# Patient Record
Sex: Female | Born: 1985 | Hispanic: No | Marital: Single | Smoking: Never smoker
Health system: Southern US, Community
[De-identification: ages and names within clinical notes are randomized; demographics above are authoritative.]

## PROBLEM LIST (undated history)

## (undated) DIAGNOSIS — R51 Headache: Secondary | ICD-10-CM

## (undated) DIAGNOSIS — J45909 Unspecified asthma, uncomplicated: Secondary | ICD-10-CM

## (undated) DIAGNOSIS — R519 Headache, unspecified: Secondary | ICD-10-CM

## (undated) DIAGNOSIS — K219 Gastro-esophageal reflux disease without esophagitis: Secondary | ICD-10-CM

## (undated) HISTORY — DX: Unspecified asthma, uncomplicated: J45.909

---

## 2015-08-12 ENCOUNTER — Other Ambulatory Visit: Payer: Self-pay | Admitting: Orthopedic Surgery

## 2015-08-12 DIAGNOSIS — M545 Low back pain, unspecified: Secondary | ICD-10-CM

## 2015-08-12 DIAGNOSIS — G8929 Other chronic pain: Secondary | ICD-10-CM

## 2015-08-26 ENCOUNTER — Ambulatory Visit
Admission: RE | Admit: 2015-08-26 | Discharge: 2015-08-26 | Disposition: A | Discharge status: Home / Self Care | Payer: Worker's Compensation | Source: Ambulatory Visit | Attending: Orthopedic Surgery | Admitting: Orthopedic Surgery

## 2015-08-26 ENCOUNTER — Other Ambulatory Visit: Payer: Self-pay | Admitting: Orthopedic Surgery

## 2015-08-26 ENCOUNTER — Inpatient Hospital Stay
Admission: RE | Admit: 2015-08-26 | Discharge: 2015-08-26 | Disposition: A | Discharge status: Home / Self Care | Payer: Self-pay | Source: Ambulatory Visit | Attending: Orthopedic Surgery | Admitting: Orthopedic Surgery

## 2015-08-26 VITALS — BP 123/87 | HR 76 | Temp 97.6°F | Resp 12 | Ht 64.0 in | Wt 190.0 lb

## 2015-08-26 DIAGNOSIS — G8929 Other chronic pain: Secondary | ICD-10-CM

## 2015-08-26 DIAGNOSIS — M545 Low back pain, unspecified: Secondary | ICD-10-CM

## 2015-08-26 MED ORDER — ONDANSETRON HCL 4 MG/2ML IJ SOLN: 4.0000 mg | Freq: Four times a day (QID) | INTRAMUSCULAR | Status: DC | PRN

## 2015-08-26 MED ORDER — IOHEXOL 180 MG/ML  SOLN
4.5000 mL | Freq: Once | INTRAMUSCULAR | Status: AC | PRN
  Administered 2015-08-26: 4.5 mL

## 2015-08-26 MED ORDER — MIDAZOLAM HCL 2 MG/2ML IJ SOLN
1.0000 mg | INTRAMUSCULAR | Status: DC | PRN
  Administered 2015-08-26: 1 mg via INTRAVENOUS

## 2015-08-26 MED ORDER — CEFAZOLIN SODIUM-DEXTROSE 2-3 GM-% IV SOLR
2.0000 g | Freq: Once | INTRAVENOUS | Status: AC
  Administered 2015-08-26: 2 g via INTRAVENOUS

## 2015-08-26 MED ORDER — FENTANYL CITRATE (PF) 100 MCG/2ML IJ SOLN
25.0000 ug | INTRAMUSCULAR | Status: DC | PRN
  Administered 2015-08-26 (×2): 50 ug via INTRAVENOUS

## 2015-08-26 MED ORDER — SODIUM CHLORIDE 0.9 % IV SOLN
Freq: Once | INTRAVENOUS | Status: AC
  Administered 2015-08-26: 08:00:00 via INTRAVENOUS

## 2015-08-26 MED ORDER — KETOROLAC TROMETHAMINE 30 MG/ML IJ SOLN
30.0000 mg | Freq: Once | INTRAMUSCULAR | Status: AC
  Administered 2015-08-26: 30 mg via INTRAVENOUS

## 2015-08-26 MED ORDER — ONDANSETRON HCL 4 MG/2ML IJ SOLN
4.0000 mg | Freq: Once | INTRAMUSCULAR | Status: AC
  Administered 2015-08-26: 4 mg via INTRAVENOUS

## 2015-08-26 NOTE — Progress Notes (Signed)
These vital signs were taken @ 0831; documented in wrong column

## 2015-08-26 NOTE — Progress Notes (Signed)
25 minutes of sedation time for discogram.  Donell SievertJeanne Miguel Christiana, RN

## 2015-08-26 NOTE — Discharge Instructions (Signed)
Discogram Post Procedure Discharge Instructions ° °1. May resume a regular diet and any medications that you routinely take (including pain medications). °2. No driving day of procedure. °3. Upon discharge go home and rest for at least 4 hours.  May use an ice pack as needed to injection sites on back.  Ice to back 30 minutes on and 30 minutes off, all day. °4. May remove bandades later, today. °5. It is not unusual to be sore for several days after this procedure. ° ° ° °Please contact our office at [phone] for the following symptoms: ° °· Fever greater than 100 degrees °· Increased swelling, pain, or redness at injection site. ° ° °Thank you for visiting [facility]. ° ° °

## 2015-10-13 ENCOUNTER — Telehealth: Payer: Self-pay | Admitting: Vascular Surgery

## 2015-10-13 NOTE — Telephone Encounter (Signed)
sched appt 5/30 at 1. Lm on hm# to inform pt of appt.

## 2015-10-13 NOTE — Telephone Encounter (Signed)
-----   Message from Glori BickersStephanie N Dehart, RN sent at 10/13/2015  1:29 PM EDT ----- Regarding: OV with TFE [name] is scheduled for an ALIF L4-5 with Drs. Early and Dumonski on Wednesday, 6/14. She will need an OV with Dr. Arbie CookeyEarly at least 2 weeks prior to procedure. Please have the patient bring LS spine films to OV.   Thanks, Judeth CornfieldStephanie

## 2015-10-22 ENCOUNTER — Other Ambulatory Visit: Payer: Self-pay | Admitting: Orthopedic Surgery

## 2015-10-28 ENCOUNTER — Encounter: Payer: Self-pay | Admitting: Vascular Surgery

## 2015-10-29 ENCOUNTER — Other Ambulatory Visit: Payer: Self-pay

## 2015-10-30 ENCOUNTER — Encounter: Payer: Self-pay | Admitting: Vascular Surgery

## 2015-11-03 ENCOUNTER — Encounter: Payer: Self-pay | Admitting: Vascular Surgery

## 2015-11-12 ENCOUNTER — Encounter (HOSPITAL_COMMUNITY)
Admission: RE | Admit: 2015-11-12 | Discharge: 2015-11-12 | Disposition: A | Discharge status: Home / Self Care | Payer: Worker's Compensation | Source: Ambulatory Visit | Attending: Orthopedic Surgery | Admitting: Orthopedic Surgery

## 2015-11-12 ENCOUNTER — Encounter (HOSPITAL_COMMUNITY): Payer: Self-pay

## 2015-11-12 ENCOUNTER — Encounter: Payer: Self-pay | Admitting: Vascular Surgery

## 2015-11-12 DIAGNOSIS — M5136 Other intervertebral disc degeneration, lumbar region: Secondary | ICD-10-CM | POA: Diagnosis not present

## 2015-11-12 DIAGNOSIS — J45909 Unspecified asthma, uncomplicated: Secondary | ICD-10-CM | POA: Insufficient documentation

## 2015-11-12 DIAGNOSIS — Z01818 Encounter for other preprocedural examination: Secondary | ICD-10-CM | POA: Diagnosis present

## 2015-11-12 DIAGNOSIS — Z01812 Encounter for preprocedural laboratory examination: Secondary | ICD-10-CM | POA: Insufficient documentation

## 2015-11-12 DIAGNOSIS — Z0183 Encounter for blood typing: Secondary | ICD-10-CM | POA: Insufficient documentation

## 2015-11-12 DIAGNOSIS — I498 Other specified cardiac arrhythmias: Secondary | ICD-10-CM | POA: Diagnosis not present

## 2015-11-12 HISTORY — DX: Headache, unspecified: R51.9

## 2015-11-12 HISTORY — DX: Headache: R51

## 2015-11-12 HISTORY — DX: Gastro-esophageal reflux disease without esophagitis: K21.9

## 2015-11-12 LAB — CBC WITH DIFFERENTIAL/PLATELET
BASOS ABS: 0 10*3/uL (ref 0.0–0.1)
BASOS PCT: 0 %
EOS PCT: 4 %
Eosinophils Absolute: 0.4 10*3/uL (ref 0.0–0.7)
HEMATOCRIT: 40.5 % (ref 36.0–46.0)
Hemoglobin: 13.4 g/dL (ref 12.0–15.0)
LYMPHS PCT: 21 %
Lymphs Abs: 1.8 10*3/uL (ref 0.7–4.0)
MCH: 27.3 pg (ref 26.0–34.0)
MCHC: 33.1 g/dL (ref 30.0–36.0)
MCV: 82.7 fL (ref 78.0–100.0)
MONO ABS: 0.6 10*3/uL (ref 0.1–1.0)
Monocytes Relative: 7 %
NEUTROS ABS: 5.7 10*3/uL (ref 1.7–7.7)
Neutrophils Relative %: 68 %
PLATELETS: 334 10*3/uL (ref 150–400)
RBC: 4.9 MIL/uL (ref 3.87–5.11)
RDW: 13.6 % (ref 11.5–15.5)
WBC: 8.4 10*3/uL (ref 4.0–10.5)

## 2015-11-12 LAB — COMPREHENSIVE METABOLIC PANEL
ALBUMIN: 4.1 g/dL (ref 3.5–5.0)
ALT: 22 U/L (ref 14–54)
AST: 16 U/L (ref 15–41)
Alkaline Phosphatase: 67 U/L (ref 38–126)
Anion gap: 7 (ref 5–15)
BUN: 14 mg/dL (ref 6–20)
CHLORIDE: 106 mmol/L (ref 101–111)
CO2: 25 mmol/L (ref 22–32)
CREATININE: 0.65 mg/dL (ref 0.44–1.00)
Calcium: 9.4 mg/dL (ref 8.9–10.3)
GFR calc Af Amer: >60 mL/min (ref >60)
GFR calc non Af Amer: >60 mL/min (ref >60)
GLUCOSE: 97 mg/dL (ref 65–99)
POTASSIUM: 3.6 mmol/L (ref 3.5–5.1)
Sodium: 138 mmol/L (ref 135–145)
Total Bilirubin: 0.8 mg/dL (ref 0.3–1.2)
Total Protein: 7.5 g/dL (ref 6.5–8.1)

## 2015-11-12 LAB — HCG, SERUM, QUALITATIVE: PREG SERUM: NEGATIVE

## 2015-11-12 LAB — URINALYSIS, ROUTINE W REFLEX MICROSCOPIC
BILIRUBIN URINE: NEGATIVE
Glucose, UA: NEGATIVE mg/dL
KETONES UR: NEGATIVE mg/dL
LEUKOCYTES UA: NEGATIVE
NITRITE: NEGATIVE
PH: 5.5 (ref 5.0–8.0)
PROTEIN: NEGATIVE mg/dL
Specific Gravity, Urine: 1.035 — ABNORMAL HIGH (ref 1.005–1.030)

## 2015-11-12 LAB — PROTIME-INR
INR: 1.12 (ref 0.00–1.49)
Prothrombin Time: 14.6 seconds (ref 11.6–15.2)

## 2015-11-12 LAB — URINE MICROSCOPIC-ADD ON

## 2015-11-12 LAB — SURGICAL PCR SCREEN
MRSA, PCR: NEGATIVE
Staphylococcus aureus: NEGATIVE

## 2015-11-12 LAB — TYPE AND SCREEN
ABO/RH(D): A POS
ANTIBODY SCREEN: NEGATIVE

## 2015-11-12 LAB — ABO/RH: ABO/RH(D): A POS

## 2015-11-12 LAB — APTT: APTT: 31 s (ref 24–37)

## 2015-11-12 NOTE — Pre-Procedure Instructions (Signed)
Elizabeth Davies  11/12/2015      [facility] [place] - [address] [place] [address] Phone: [phone] Fax: [phone]    Your procedure is scheduled on Wednesday, June 14th, 2017.  [facility] Admitting at 6:30 A.M.   Call this number if you have problems the morning of surgery:  [phone]   Remember:  Do not eat food or drink liquids after midnight.   Take these medicines the morning of surgery with A SIP OF WATER: Acetaminophen (Tylenol) if needed, Albuterol inhaler if needed (please bring with you).  Stop taking: Aspirin, NSAIDS, Aleve, Naproxen, Ibuprofen, Advil, Motrin, BC's, Goody's, Fish oil, all herbal medications, and all vitamins.    Do not wear jewelry, make-up or nail polish.  Do not wear lotions, powders, or perfumes.  You may NOT wear deodorant.  Do not shave 48 hours prior to surgery.    Do not bring valuables to the hospital.   [facility] is not responsible for any belongings or valuables.  Contacts, dentures or bridgework may not be worn into surgery.  Leave your suitcase in the car.  After surgery it may be brought to your room.  For patients admitted to the hospital, discharge time will be determined by your treatment team.  Patients discharged the day of surgery will not be allowed to drive home.   Special instructions:  See attached.   Please read over the following fact sheets that you were given. Blood Transfusion Information and MRSA Information     [place]- Preparing For Surgery  Before surgery, you can play an important role. Because skin is not sterile, your skin needs to be as free of germs as possible. You can reduce the number of germs on your skin by washing with CHG (chlorahexidine gluconate) Soap before surgery.  CHG is an antiseptic cleaner which kills germs and bonds with the skin to continue killing germs even after washing.  Please  do not use if you have an allergy to CHG or antibacterial soaps. If your skin becomes reddened/irritated stop using the CHG.  Do not shave (including legs and underarms) for at least 48 hours prior to first CHG shower. It is OK to shave your face.  Please follow these instructions carefully.   1. Shower the NIGHT BEFORE SURGERY and the MORNING OF SURGERY with CHG.   2. If you chose to wash your hair, wash your hair first as usual with your normal shampoo.  3. After you shampoo, rinse your hair and body thoroughly to remove the shampoo.  4. Use CHG as you would any other liquid soap. You can apply CHG directly to the skin and wash gently with a scrungie or a clean washcloth.   5. Apply the CHG Soap to your body ONLY FROM THE NECK DOWN.  Do not use on open wounds or open sores. Avoid contact with your eyes, ears, mouth and genitals (private parts). Wash genitals (private parts) with your normal soap.  6. Wash thoroughly, paying special attention to the area where your surgery will be performed.  7. Thoroughly rinse your body with warm water from the neck down.  8. DO NOT shower/wash with your normal soap after using and rinsing off the CHG Soap.  9. Pat yourself dry with a CLEAN TOWEL.   10. Wear CLEAN PAJAMAS   11. Place CLEAN SHEETS on your bed the night of your first shower  and DO NOT SLEEP WITH PETS.    Day of Surgery: Do not apply any deodorants/lotions. Please wear clean clothes to the hospital/surgery center.

## 2015-11-12 NOTE — Progress Notes (Signed)
PCP- Elizabeth HugerSylvia Dickerson in [place], [place] Cardiologist - denies  EKG - denies CXR - denies  Echo/stress test/cardiac cath - denies  Patient denies chest pain and shortness of breath at PAT appointment.  Interpreter present at PAT appointment.

## 2015-11-12 NOTE — Pre-Procedure Instructions (Signed)
   Instrucciones Para Antes de la Ciruga   Su ciruga est programada para-(your procedure is scheduled on) Wednesday, June 14th, 2017.   [facility] Admitting - at 6:30 AM.    Por favor llame al [phone] si tiene algn problema en la maana de la ciruga. (please call if you have any problems the morning of surgery.)                  Recuerde: (Remember)   No coma alimentos ni tome lquidos, incluyendo agua, despus de la medianoche del  (Do not eat food or drink liquids including water after midnight on Tuesday   Tome estas medicinas en la maana de la ciruga con un SORBITO de agua (take these meds the morning of surgery with a SIP of water) Acetaminophen (Tylenol) if needed, Albuterol inhaler if needed (please bring with you)   Puede cepillarse los dientes en la maana de la [place]. (you may brush your teeth the morning of surgery)   No use joyas, maquillaje de ojos, lpiz labial, crema para el cuerpo o esmalte de uas oscuro. (Do not wear jewelry, eye makeup, lipstick, body lotion, or dark fingernail polish)   Puede usar desodorante. (you may wear deodorant)   Si va a ser ingresado despues de la ciruga, deje la maleta en el carro hasta que se le haya asignado una habitacin. (If you are to be admitted after surgery, leave suitcase in car until your room has been assigned.)   A los pacientes que se les d de alta el mismo da no se les permitir manejar a casa.  (Patients discharged on the day of surgery will not be allowed to drive home)   Use ropa suelta y cmoda de regreso a casa. (wear loose comfortable clothes for ride home)    Firma del paciente (patient signature) ______________________________________   Adam PhenixAdriana Groome  11/12/2015      [facility] [place] - [address] [place] [address] Phone: [phone] Fax: [phone]    Your  procedure is scheduled on Wednesday, June 14th, 2017.  [facility] at 6:30 A.M.   Call this number if you have problems the morning of surgery:  [phone]   Remember:  Do not eat food or drink liquids after midnight.   Take these medicines the morning of surgery with A SIP OF WATER: Acetaminophen (Tylenol) if needed, Albuterol inhaler if needed (please bring with you).  Stop taking: Aspirin, NSAIDS, Aleve, Naproxen, Ibuprofen, Advil, Motrin, BC's, Goody's, Fish oil, all herbal medications, and all vitamins.    Do not wear jewelry, make-up or nail polish.  Do not wear lotions, powders, or perfumes.  You may wear deodorant.  Do not shave 48 hours prior to surgery.    Do not bring valuables to the hospital.   [facility] is not responsible for any belongings or valuables.  Contacts, dentures or bridgework may not be worn into surgery.  Leave your suitcase in the car.  After surgery it may be brought to your room.  For patients admitted to the hospital, discharge time will be determined by your treatment team.  Patients discharged the day of surgery will not be allowed to drive home.   Special instructions:  See attached.   Please read over the following fact sheets that you were given. MRSA Information

## 2015-11-13 NOTE — H&P (Signed)
     PREOPERATIVE H&P  Chief Complaint: Low back pain  HPI: Elizabeth Davies is a 30 y.o. female who presents with ongoing pain in the low back  MRI reveals and x-rays reveal degenerative disc disease associated with the L4-L5 level.  The patient has had ongoing pain now for approximately 5 months, after a work injury dated 06/25/2014.  Patient is failed appropriate conservative treatment measures and continued to have pain.  A discogram was notable for concordant pain at L4-L5.  (see office notes for additional details regarding the patient's full course of treatment)  Past Medical History  Diagnosis Date  . Asthma     pt. denies  . Acid reflux   . Headache    Past Surgical History  Procedure Laterality Date  . Cesarean section      x2   Social History   Social History  . Marital Status: Single    Spouse Name: N/A  . Number of Children: N/A  . Years of Education: N/A   Social History Main Topics  . Smoking status: Never Smoker   . Smokeless tobacco: Never Used  . Alcohol Use: No  . Drug Use: No  . Sexual Activity: Not on file   Other Topics Concern  . Not on file   Social History Narrative   No family history on file. No Known Allergies Prior to Admission medications   Medication Sig Start Date End Date Taking? Authorizing Provider  acetaminophen (TYLENOL) 325 MG tablet Take 650 mg by mouth every 6 (six) hours as needed for mild pain.   Yes Historical Provider, MD  albuterol (PROVENTIL HFA;VENTOLIN HFA) 108 (90 Base) MCG/ACT inhaler Inhale into the lungs every 6 (six) hours as needed for wheezing or shortness of breath.   Yes Historical Provider, MD     All other systems have been reviewed and were otherwise negative with the exception of those mentioned in the HPI and as above.  Physical Exam: There were no vitals filed for this visit.  General: Alert, no acute distress Cardiovascular: No pedal edema Respiratory: No cyanosis, no use of accessory  musculature Skin: No lesions in the area of chief complaint Neurologic: Sensation intact distally Psychiatric: Patient is competent for consent with normal mood and affect Lymphatic: No axillary or cervical lymphadenopathy  MUSCULOSKELETAL: patient is clearly uncomfortable and walks with a very slow and guarded gait  Assessment/Plan: degenerative disc disease   Plan for Procedure(s): LUMBAR 4-5 ANTERIOR LUMBAR FUSION  LUMBAR 4-5 POSTERIOR LUMBAR FUSION WITH INSTRUMENTATION AND ALLOGRAFT     Emilee HeroUMONSKI,Kairee Kozma LEONARD, MD 11/13/2015 8:13 AM

## 2015-11-17 ENCOUNTER — Encounter: Payer: Self-pay | Admitting: Vascular Surgery

## 2015-11-17 ENCOUNTER — Ambulatory Visit (INDEPENDENT_AMBULATORY_CARE_PROVIDER_SITE_OTHER): Payer: Worker's Compensation | Admitting: Vascular Surgery

## 2015-11-17 VITALS — BP 134/89 | HR 90 | Ht 64.0 in | Wt 184.1 lb

## 2015-11-17 DIAGNOSIS — M5136 Other intervertebral disc degeneration, lumbar region: Secondary | ICD-10-CM

## 2015-11-17 MED ORDER — CEFAZOLIN SODIUM-DEXTROSE 2-4 GM/100ML-% IV SOLN
2.0000 g | INTRAVENOUS | Status: AC
  Administered 2015-11-18: 2 g via INTRAVENOUS
  Filled 2015-11-17: qty 100

## 2015-11-17 NOTE — Progress Notes (Signed)
[facility] of [place]  Patient name: Elizabeth Davies MRN: [id number] DOB: 03-19-1986 Sex: female  REASON FOR CONSULT: Degenerative disc disease  HPI: Elizabeth Davies is a 30 y.o. female, who is here today for discussion of my role in anterior abdominal exposure. Has severe degenerative disc disease. She has been seen by Dr.Dumonski who has recommended anterior approach for disc surgery. Her only prior abdominal surgery has been necessary and section 2. She has no major medical difficulties.  Past Medical History  Diagnosis Date  . Asthma     pt. denies  . Acid reflux   . Headache     History reviewed. No pertinent family history.  SOCIAL HISTORY: Social History   Social History  . Marital Status: Single    Spouse Name: N/A  . Number of Children: N/A  . Years of Education: N/A   Occupational History  . Not on file.   Social History Main Topics  . Smoking status: Never Smoker   . Smokeless tobacco: Never Used  . Alcohol Use: No  . Drug Use: No  . Sexual Activity: Not on file   Other Topics Concern  . Not on file   Social History Narrative    No Known Allergies  Current Outpatient Prescriptions  Medication Sig Dispense Refill  . acetaminophen (TYLENOL) 325 MG tablet Take 650 mg by mouth every 6 (six) hours as needed for mild pain. Reported on 11/17/2015    . albuterol (PROVENTIL HFA;VENTOLIN HFA) 108 (90 Base) MCG/ACT inhaler Inhale into the lungs every 6 (six) hours as needed for wheezing or shortness of breath. Reported on 11/17/2015     No current facility-administered medications for this visit.    REVIEW OF SYSTEMS:   denotes positive finding,  denotes negative finding Cardiac  Comments:  Chest pain or chest pressure:    Shortness of breath upon exertion:    Short of breath when lying flat:    Irregular heart rhythm:        Vascular    Pain in calf, thigh, or hip brought on by ambulation:     Pain in feet at night that wakes you up from your sleep:     Blood clot in your veins:    Leg swelling:         Pulmonary    Oxygen at home:    Productive cough:     Wheezing:         Neurologic    Sudden weakness in arms or legs:     Sudden numbness in arms or legs:     Sudden onset of difficulty speaking or slurred speech:    Temporary loss of vision in one eye:     Problems with dizziness:         Gastrointestinal    Blood in stool:     Vomited blood:         Genitourinary    Burning when urinating:     Blood in urine:        Psychiatric    Major depression:         Hematologic    Bleeding problems:    Problems with blood clotting too easily:        Skin    Rashes or ulcers:        Constitutional    Fever or chills:      PHYSICAL EXAM: Filed Vitals:   11/17/15 0944  BP: 134/89  Pulse: 90  Height: 5\' 4"  (1.626 m)  Weight: 184 lb 1.6 oz (83.507 kg)  SpO2: 100%    GENERAL: The patient is a well-nourished female, in no acute distress. The vital signs are documented above. CARDIAC: There is a regular rate and rhythm.  VASCULAR: 2+ radial and 2+ dorsalis pedis pulses bilaterally  PULMONARY: There is good air exchange bilaterally without wheezing or rales. ABDOMEN: Soft and non-tender with normal pitched bowel sounds.  MUSCULOSKELETAL: There are no major deformities or cyanosis. NEUROLOGIC: No focal weakness or paresthesias are detected. SKIN: There are no ulcers or rashes noted. PSYCHIATRIC: The patient has a normal affect.  DATA:  Reviewed her CT scan of her spine. This shows no calcification of her aorta overlying her spine.   MEDICAL ISSUES: She is here today with her Spanish medical interpreter. I explained that her spine surgeon has determined that the most appropriate to treat her spine disease with surgery and that an anterior approach was most appropriate. I explained my role in exposure of the anterior surface of the L4-5 disc. Explained  mobilization of the intraperitoneal contents the left ureter and the arterial and venous structures over the spine. Explain the potential for injury to all of these. She understands and wishes to proceed with surgery as scheduled for tomorrow. Discussed this with both the Spanish interpreter and the Workmen's Comp. Spanish interpreter in the room explaining this to the patient.  Larina Earthlyodd F. Anaia Frith, MD FACS [facility] Office Tel [phone] Pager [phone]

## 2015-11-17 NOTE — Anesthesia Preprocedure Evaluation (Addendum)
Anesthesia Evaluation  Patient identified by MRN, date of birth, ID band Patient awake    Reviewed: Allergy & Precautions, H&P , NPO status , Patient's Chart, lab work & pertinent test results  Airway Mallampati: II  TM Distance: >3 FB Neck ROM: Full    Dental no notable dental hx. (+) Teeth Intact, Dental Advisory Given   Pulmonary asthma ,    Pulmonary exam normal breath sounds clear to auscultation       Cardiovascular negative cardio ROS   Rhythm:Regular Rate:Normal     Neuro/Psych  Headaches, negative psych ROS   GI/Hepatic negative GI ROS, Neg liver ROS,   Endo/Other  negative endocrine ROS  Renal/GU negative Renal ROS  negative genitourinary   Musculoskeletal   Abdominal   Peds  Hematology negative hematology ROS (+)   Anesthesia Other Findings   Reproductive/Obstetrics negative OB ROS                            Anesthesia Physical Anesthesia Plan  ASA: II  Anesthesia Plan: General   Post-op Pain Management:    Induction: Intravenous  Airway Management Planned: Oral ETT  Additional Equipment: Arterial line  Intra-op Plan:   Post-operative Plan: Extubation in OR  Informed Consent: I have reviewed the patients History and Physical, chart, labs and discussed the procedure including the risks, benefits and alternatives for the proposed anesthesia with the patient or authorized representative who has indicated his/her understanding and acceptance.   Dental advisory given  Plan Discussed with: CRNA  Anesthesia Plan Comments:        Anesthesia Quick Evaluation

## 2015-11-18 ENCOUNTER — Inpatient Hospital Stay (HOSPITAL_COMMUNITY): Payer: Worker's Compensation | Admitting: Emergency Medicine

## 2015-11-18 ENCOUNTER — Inpatient Hospital Stay (HOSPITAL_COMMUNITY)
Admission: RE | Admit: 2015-11-18 | Discharge: 2015-11-20 | DRG: 455 | Disposition: A | Discharge status: Home Health | Payer: Worker's Compensation | Source: Ambulatory Visit | Attending: Orthopedic Surgery | Admitting: Orthopedic Surgery

## 2015-11-18 ENCOUNTER — Inpatient Hospital Stay (HOSPITAL_COMMUNITY): Payer: Worker's Compensation | Admitting: Anesthesiology

## 2015-11-18 ENCOUNTER — Encounter (HOSPITAL_COMMUNITY)
Admission: RE | Disposition: A | Discharge status: Home Health | Payer: Self-pay | Source: Ambulatory Visit | Attending: Orthopedic Surgery

## 2015-11-18 ENCOUNTER — Inpatient Hospital Stay (HOSPITAL_COMMUNITY): Payer: Worker's Compensation

## 2015-11-18 DIAGNOSIS — M549 Dorsalgia, unspecified: Secondary | ICD-10-CM | POA: Diagnosis present

## 2015-11-18 DIAGNOSIS — M5416 Radiculopathy, lumbar region: Principal | ICD-10-CM | POA: Diagnosis present

## 2015-11-18 DIAGNOSIS — Z419 Encounter for procedure for purposes other than remedying health state, unspecified: Secondary | ICD-10-CM

## 2015-11-18 DIAGNOSIS — M5136 Other intervertebral disc degeneration, lumbar region: Secondary | ICD-10-CM | POA: Diagnosis present

## 2015-11-18 DIAGNOSIS — M5137 Other intervertebral disc degeneration, lumbosacral region: Secondary | ICD-10-CM

## 2015-11-18 DIAGNOSIS — M51369 Other intervertebral disc degeneration, lumbar region without mention of lumbar back pain or lower extremity pain: Secondary | ICD-10-CM | POA: Diagnosis present

## 2015-11-18 HISTORY — PX: ANTERIOR LUMBAR FUSION: SHX1170

## 2015-11-18 HISTORY — PX: ABDOMINAL EXPOSURE: SHX5708

## 2015-11-18 SURGERY — ANTERIOR LUMBAR FUSION 1 LEVEL
Anesthesia: General

## 2015-11-18 MED ORDER — ARTIFICIAL TEARS OP OINT
TOPICAL_OINTMENT | OPHTHALMIC | Status: DC | PRN
  Administered 2015-11-18: 1 via OPHTHALMIC

## 2015-11-18 MED ORDER — SUCCINYLCHOLINE CHLORIDE 200 MG/10ML IV SOSY
PREFILLED_SYRINGE | INTRAVENOUS | Status: AC
  Filled 2015-11-18: qty 10

## 2015-11-18 MED ORDER — LIDOCAINE 2% (20 MG/ML) 5 ML SYRINGE
INTRAMUSCULAR | Status: AC
  Filled 2015-11-18: qty 5

## 2015-11-18 MED ORDER — HYDROMORPHONE HCL 1 MG/ML IJ SOLN
0.2500 mg | INTRAMUSCULAR | Status: DC | PRN
  Administered 2015-11-18: 0.5 mg via INTRAVENOUS
  Administered 2015-11-18: 0.25 mg via INTRAVENOUS
  Administered 2015-11-18: 0.5 mg via INTRAVENOUS
  Administered 2015-11-18: 0.25 mg via INTRAVENOUS

## 2015-11-18 MED ORDER — SUCCINYLCHOLINE CHLORIDE 20 MG/ML IJ SOLN
INTRAMUSCULAR | Status: DC | PRN
  Administered 2015-11-18: 100 mg via INTRAVENOUS

## 2015-11-18 MED ORDER — ACETAMINOPHEN 325 MG PO TABS: 650.0000 mg | ORAL_TABLET | ORAL | Status: DC | PRN

## 2015-11-18 MED ORDER — MENTHOL 3 MG MT LOZG: 1.0000 | LOZENGE | OROMUCOSAL | Status: DC | PRN

## 2015-11-18 MED ORDER — CHLORHEXIDINE GLUCONATE 4 % EX LIQD
60.0000 mL | Freq: Once | CUTANEOUS | Status: DC
  Filled 2015-11-18: qty 60

## 2015-11-18 MED ORDER — SODIUM CHLORIDE 0.9% FLUSH
3.0000 mL | Freq: Two times a day (BID) | INTRAVENOUS | Status: DC
  Administered 2015-11-18 – 2015-11-20 (×3): 3 mL via INTRAVENOUS

## 2015-11-18 MED ORDER — PHENYLEPHRINE 40 MCG/ML (10ML) SYRINGE FOR IV PUSH (FOR BLOOD PRESSURE SUPPORT)
PREFILLED_SYRINGE | INTRAVENOUS | Status: AC
  Filled 2015-11-18: qty 10

## 2015-11-18 MED ORDER — ARTIFICIAL TEARS OP OINT
TOPICAL_OINTMENT | OPHTHALMIC | Status: AC
  Filled 2015-11-18: qty 10.5

## 2015-11-18 MED ORDER — PROMETHAZINE HCL 25 MG/ML IJ SOLN
12.5000 mg | Freq: Four times a day (QID) | INTRAMUSCULAR | Status: DC | PRN
Start: 2015-11-18 — End: 2015-11-20
  Administered 2015-11-19: 12.5 mg via INTRAVENOUS
  Filled 2015-11-18 (×2): qty 1

## 2015-11-18 MED ORDER — SODIUM CHLORIDE 0.9% FLUSH: 3.0000 mL | INTRAVENOUS | Status: DC | PRN

## 2015-11-18 MED ORDER — PROPOFOL 10 MG/ML IV BOLUS
INTRAVENOUS | Status: AC
  Filled 2015-11-18: qty 20

## 2015-11-18 MED ORDER — HYDROMORPHONE HCL 1 MG/ML IJ SOLN
INTRAMUSCULAR | Status: DC | PRN
  Administered 2015-11-18: .2 mg via INTRAVENOUS
  Administered 2015-11-18: .3 mg via INTRAVENOUS
  Administered 2015-11-18: .4 mg via INTRAVENOUS
  Administered 2015-11-18: .1 mg via INTRAVENOUS

## 2015-11-18 MED ORDER — DIAZEPAM 5 MG PO TABS
5.0000 mg | ORAL_TABLET | Freq: Four times a day (QID) | ORAL | Status: DC | PRN
  Administered 2015-11-19 – 2015-11-20 (×2): 5 mg via ORAL
  Filled 2015-11-18 (×2): qty 1

## 2015-11-18 MED ORDER — PHENYLEPHRINE HCL 10 MG/ML IJ SOLN
INTRAMUSCULAR | Status: DC | PRN
  Administered 2015-11-18: 80 ug via INTRAVENOUS

## 2015-11-18 MED ORDER — SODIUM CHLORIDE 0.9 % IV SOLN: INTRAVENOUS | Status: DC

## 2015-11-18 MED ORDER — MIDAZOLAM HCL 5 MG/5ML IJ SOLN
INTRAMUSCULAR | Status: DC | PRN
  Administered 2015-11-18: 2 mg via INTRAVENOUS

## 2015-11-18 MED ORDER — ACETAMINOPHEN 650 MG RE SUPP: 650.0000 mg | RECTAL | Status: DC | PRN

## 2015-11-18 MED ORDER — HYDROMORPHONE HCL 1 MG/ML IJ SOLN
INTRAMUSCULAR | Status: AC
  Filled 2015-11-18: qty 1

## 2015-11-18 MED ORDER — SUGAMMADEX SODIUM 200 MG/2ML IV SOLN
INTRAVENOUS | Status: AC
  Filled 2015-11-18: qty 2

## 2015-11-18 MED ORDER — EPHEDRINE 5 MG/ML INJ
INTRAVENOUS | Status: AC
  Filled 2015-11-18: qty 10

## 2015-11-18 MED ORDER — LACTATED RINGERS IV SOLN
INTRAVENOUS | Status: DC | PRN
  Administered 2015-11-18 (×3): via INTRAVENOUS

## 2015-11-18 MED ORDER — THROMBIN 20000 UNITS EX SOLR
CUTANEOUS | Status: AC
  Filled 2015-11-18: qty 20000

## 2015-11-18 MED ORDER — METHYLENE BLUE 0.5 % INJ SOLN
INTRAVENOUS | Status: AC
  Filled 2015-11-18: qty 10

## 2015-11-18 MED ORDER — CEFAZOLIN SODIUM-DEXTROSE 2-4 GM/100ML-% IV SOLN
INTRAVENOUS | Status: AC
  Administered 2015-11-18: 2 g via INTRAVENOUS
  Filled 2015-11-18: qty 100

## 2015-11-18 MED ORDER — CEFAZOLIN SODIUM 1-5 GM-% IV SOLN
1.0000 g | Freq: Three times a day (TID) | INTRAVENOUS | Status: AC
  Administered 2015-11-18 – 2015-11-19 (×2): 1 g via INTRAVENOUS
  Filled 2015-11-18 (×2): qty 50

## 2015-11-18 MED ORDER — LACTATED RINGERS IV SOLN
INTRAVENOUS | Status: DC | PRN
  Administered 2015-11-18: 09:00:00 via INTRAVENOUS

## 2015-11-18 MED ORDER — ROCURONIUM BROMIDE 50 MG/5ML IV SOLN
INTRAVENOUS | Status: AC
  Filled 2015-11-18: qty 1

## 2015-11-18 MED ORDER — ONDANSETRON HCL 4 MG/2ML IJ SOLN
4.0000 mg | INTRAMUSCULAR | Status: DC | PRN
Start: 2015-11-18 — End: 2015-11-20
  Administered 2015-11-18: 4 mg via INTRAVENOUS
  Filled 2015-11-18: qty 2

## 2015-11-18 MED ORDER — POVIDONE-IODINE 7.5 % EX SOLN
Freq: Once | CUTANEOUS | Status: DC
  Filled 2015-11-18: qty 118

## 2015-11-18 MED ORDER — PROPOFOL 500 MG/50ML IV EMUL
INTRAVENOUS | Status: DC | PRN
  Administered 2015-11-18: 125 ug/kg/min via INTRAVENOUS

## 2015-11-18 MED ORDER — FENTANYL CITRATE (PF) 250 MCG/5ML IJ SOLN
INTRAMUSCULAR | Status: AC
  Filled 2015-11-18: qty 5

## 2015-11-18 MED ORDER — BUPIVACAINE-EPINEPHRINE (PF) 0.25% -1:200000 IJ SOLN
INTRAMUSCULAR | Status: AC
  Filled 2015-11-18: qty 30

## 2015-11-18 MED ORDER — SODIUM CHLORIDE 0.9 % IV SOLN
0.0500 ug/kg/min | INTRAVENOUS | Status: AC
  Administered 2015-11-18: 0.15 ug/kg/min via INTRAVENOUS
  Filled 2015-11-18: qty 5000

## 2015-11-18 MED ORDER — SUGAMMADEX SODIUM 200 MG/2ML IV SOLN
INTRAVENOUS | Status: DC | PRN
  Administered 2015-11-18: 150 mg via INTRAVENOUS

## 2015-11-18 MED ORDER — OXYCODONE-ACETAMINOPHEN 5-325 MG PO TABS
1.0000 | ORAL_TABLET | ORAL | Status: DC | PRN
  Administered 2015-11-18 – 2015-11-19 (×2): 2 via ORAL
  Filled 2015-11-18 (×3): qty 2

## 2015-11-18 MED ORDER — BISACODYL 5 MG PO TBEC: 5.0000 mg | DELAYED_RELEASE_TABLET | Freq: Every day | ORAL | Status: DC | PRN

## 2015-11-18 MED ORDER — ALUM & MAG HYDROXIDE-SIMETH 200-200-20 MG/5ML PO SUSP: 30.0000 mL | Freq: Four times a day (QID) | ORAL | Status: DC | PRN

## 2015-11-18 MED ORDER — MIDAZOLAM HCL 2 MG/2ML IJ SOLN
INTRAMUSCULAR | Status: AC
  Filled 2015-11-18: qty 2

## 2015-11-18 MED ORDER — ONDANSETRON HCL 4 MG/2ML IJ SOLN
INTRAMUSCULAR | Status: DC | PRN
  Administered 2015-11-18 (×2): 4 mg via INTRAVENOUS

## 2015-11-18 MED ORDER — ALBUTEROL SULFATE (2.5 MG/3ML) 0.083% IN NEBU: 3.0000 mL | INHALATION_SOLUTION | Freq: Four times a day (QID) | RESPIRATORY_TRACT | Status: DC | PRN

## 2015-11-18 MED ORDER — PROPOFOL 1000 MG/100ML IV EMUL
INTRAVENOUS | Status: AC
  Filled 2015-11-18: qty 100

## 2015-11-18 MED ORDER — PROPOFOL 1000 MG/100ML IV EMUL
INTRAVENOUS | Status: AC
  Filled 2015-11-18: qty 300

## 2015-11-18 MED ORDER — HYDROCODONE-ACETAMINOPHEN 5-325 MG PO TABS
1.0000 | ORAL_TABLET | ORAL | Status: DC | PRN
  Administered 2015-11-19 – 2015-11-20 (×4): 2 via ORAL
  Filled 2015-11-18 (×4): qty 2

## 2015-11-18 MED ORDER — BUPIVACAINE-EPINEPHRINE 0.25% -1:200000 IJ SOLN
INTRAMUSCULAR | Status: DC | PRN
  Administered 2015-11-18: 20 mL

## 2015-11-18 MED ORDER — EPHEDRINE SULFATE 50 MG/ML IJ SOLN
INTRAMUSCULAR | Status: DC | PRN
  Administered 2015-11-18: 5 mg via INTRAVENOUS

## 2015-11-18 MED ORDER — LIDOCAINE HCL (CARDIAC) 20 MG/ML IV SOLN
INTRAVENOUS | Status: DC | PRN
  Administered 2015-11-18: 80 mg via INTRAVENOUS

## 2015-11-18 MED ORDER — THROMBIN 20000 UNITS EX KIT
PACK | CUTANEOUS | Status: DC | PRN
  Administered 2015-11-18: 20000 [IU] via TOPICAL

## 2015-11-18 MED ORDER — SENNOSIDES-DOCUSATE SODIUM 8.6-50 MG PO TABS: 1.0000 | ORAL_TABLET | Freq: Every evening | ORAL | Status: DC | PRN

## 2015-11-18 MED ORDER — ROCURONIUM BROMIDE 100 MG/10ML IV SOLN
INTRAVENOUS | Status: DC | PRN
  Administered 2015-11-18: 50 mg via INTRAVENOUS
  Administered 2015-11-18: 30 mg via INTRAVENOUS

## 2015-11-18 MED ORDER — MORPHINE SULFATE (PF) 2 MG/ML IV SOLN
1.0000 mg | INTRAVENOUS | Status: DC | PRN
  Administered 2015-11-18: 2 mg via INTRAVENOUS
  Filled 2015-11-18: qty 1

## 2015-11-18 MED ORDER — ONDANSETRON HCL 4 MG/2ML IJ SOLN
INTRAMUSCULAR | Status: AC
  Filled 2015-11-18: qty 4

## 2015-11-18 MED ORDER — PROPOFOL 10 MG/ML IV BOLUS
INTRAVENOUS | Status: DC | PRN
  Administered 2015-11-18: 180 mg via INTRAVENOUS

## 2015-11-18 MED ORDER — FLEET ENEMA 7-19 GM/118ML RE ENEM: 1.0000 | ENEMA | Freq: Once | RECTAL | Status: DC | PRN

## 2015-11-18 MED ORDER — SODIUM CHLORIDE 0.9 % IV SOLN: 250.0000 mL | INTRAVENOUS | Status: DC

## 2015-11-18 MED ORDER — PHENOL 1.4 % MT LIQD: 1.0000 | OROMUCOSAL | Status: DC | PRN

## 2015-11-18 MED ORDER — 0.9 % SODIUM CHLORIDE (POUR BTL) OPTIME
TOPICAL | Status: DC | PRN
  Administered 2015-11-18: 1000 mL

## 2015-11-18 MED ORDER — DOCUSATE SODIUM 100 MG PO CAPS
100.0000 mg | ORAL_CAPSULE | Freq: Two times a day (BID) | ORAL | Status: DC
  Administered 2015-11-19 – 2015-11-20 (×2): 100 mg via ORAL
  Filled 2015-11-18 (×2): qty 1

## 2015-11-18 MED ORDER — ZOLPIDEM TARTRATE 5 MG PO TABS: 5.0000 mg | ORAL_TABLET | Freq: Every evening | ORAL | Status: DC | PRN

## 2015-11-18 MED ORDER — FENTANYL CITRATE (PF) 100 MCG/2ML IJ SOLN
INTRAMUSCULAR | Status: DC | PRN
  Administered 2015-11-18: 100 ug via INTRAVENOUS
  Administered 2015-11-18 (×3): 50 ug via INTRAVENOUS

## 2015-11-18 MED FILL — Heparin Sodium (Porcine) Inj 1000 Unit/ML: INTRAMUSCULAR | Qty: 30 | Status: AC

## 2015-11-18 MED FILL — Sodium Chloride IV Soln 0.9%: INTRAVENOUS | Qty: 1000 | Status: AC

## 2015-11-18 SURGICAL SUPPLY — 134 items
APPLIER CLIP 11 MED OPEN (CLIP) ×3
BENZOIN TINCTURE PRP APPL 2/3 (GAUZE/BANDAGES/DRESSINGS) ×6 IMPLANT
BLADE SURG 10 STRL SS (BLADE) ×6 IMPLANT
BLADE SURG ROTATE 9660 (MISCELLANEOUS) IMPLANT
BUR PRESCISION 1.7 ELITE (BURR) ×3 IMPLANT
BUR ROUND PRECISION 4.0 (BURR) IMPLANT
BUR ROUND PRECISION 4.0MM (BURR)
BUR SABER RD CUTTING 3.0 (BURR) IMPLANT
BUR SABER RD CUTTING 3.0MM (BURR)
CAGE COUGAR MED 14MM-10 (Cage) ×3 IMPLANT
CARTRIDGE OIL MAESTRO DRILL (MISCELLANEOUS) IMPLANT
CLIP APPLIE 11 MED OPEN (CLIP) ×1 IMPLANT
CLIP LIGATING EXTRA MED SLVR (CLIP) IMPLANT
CLIP LIGATING EXTRA SM BLUE (MISCELLANEOUS) IMPLANT
CLOSURE WOUND 1/2 X4 (GAUZE/BANDAGES/DRESSINGS) ×2
CONT SPEC STER OR (MISCELLANEOUS) ×3 IMPLANT
CORDS BIPOLAR (ELECTRODE) ×3 IMPLANT
COVER MAYO STAND STRL (DRAPES) ×6 IMPLANT
COVER SURGICAL LIGHT HANDLE (MISCELLANEOUS) ×3 IMPLANT
DERMABOND ADVANCED (GAUZE/BANDAGES/DRESSINGS) ×2
DERMABOND ADVANCED .7 DNX12 (GAUZE/BANDAGES/DRESSINGS) ×1 IMPLANT
DIFFUSER DRILL AIR PNEUMATIC (MISCELLANEOUS) IMPLANT
DRAIN CHANNEL 15F RND FF W/TCR (WOUND CARE) IMPLANT
DRAPE C-ARM 42X72 X-RAY (DRAPES) ×6 IMPLANT
DRAPE C-ARMOR (DRAPES) IMPLANT
DRAPE INCISE IOBAN 66X45 STRL (DRAPES) IMPLANT
DRAPE POUCH INSTRU U-SHP 10X18 (DRAPES) ×3 IMPLANT
DRAPE SURG 17X23 STRL (DRAPES) ×12 IMPLANT
DRSG MEPILEX BORDER 4X12 (GAUZE/BANDAGES/DRESSINGS) ×3 IMPLANT
DRSG MEPILEX BORDER 4X8 (GAUZE/BANDAGES/DRESSINGS) ×3 IMPLANT
DURAPREP 26ML APPLICATOR (WOUND CARE) ×3 IMPLANT
ELECT BLADE 4.0 EZ CLEAN MEGAD (MISCELLANEOUS) ×3
ELECT CAUTERY BLADE 6.4 (BLADE) ×3 IMPLANT
ELECT REM PT RETURN 9FT ADLT (ELECTROSURGICAL) ×3
ELECTRODE BLDE 4.0 EZ CLN MEGD (MISCELLANEOUS) ×1 IMPLANT
ELECTRODE REM PT RTRN 9FT ADLT (ELECTROSURGICAL) ×1 IMPLANT
EVACUATOR SILICONE 100CC (DRAIN) IMPLANT
FEE INTRAOP MONITOR IMPULS NCS (MISCELLANEOUS) ×1 IMPLANT
GAUZE SPONGE 4X4 12PLY STRL (GAUZE/BANDAGES/DRESSINGS) ×3 IMPLANT
GAUZE SPONGE 4X4 16PLY XRAY LF (GAUZE/BANDAGES/DRESSINGS) ×15 IMPLANT
GLOVE BIO SURGEON STRL SZ7 (GLOVE) ×6 IMPLANT
GLOVE BIO SURGEON STRL SZ8 (GLOVE) ×6 IMPLANT
GLOVE BIOGEL PI IND STRL 7.0 (GLOVE) ×2 IMPLANT
GLOVE BIOGEL PI IND STRL 8 (GLOVE) ×3 IMPLANT
GLOVE BIOGEL PI INDICATOR 7.0 (GLOVE) ×4
GLOVE BIOGEL PI INDICATOR 8 (GLOVE) ×6
GLOVE SS BIOGEL STRL SZ 7.5 (GLOVE) ×2 IMPLANT
GLOVE SUPERSENSE BIOGEL SZ 7.5 (GLOVE) ×4
GLOVE SURG SS PI 7.0 STRL IVOR (GLOVE) ×12 IMPLANT
GLOVE SURG SS PI 7.5 STRL IVOR (GLOVE) ×9 IMPLANT
GOWN STRL REUS W/ TWL LRG LVL3 (GOWN DISPOSABLE) ×6 IMPLANT
GOWN STRL REUS W/ TWL XL LVL3 (GOWN DISPOSABLE) ×2 IMPLANT
GOWN STRL REUS W/TWL LRG LVL3 (GOWN DISPOSABLE) ×12
GOWN STRL REUS W/TWL XL LVL3 (GOWN DISPOSABLE) ×4
GUIDEWIRE BLUNT VIPER II 1.45 (WIRE) ×3 IMPLANT
GUIDEWIRE SHARP VIPER II (WIRE) ×12 IMPLANT
HEMOSTAT SURGICEL 2X14 (HEMOSTASIS) IMPLANT
INSERT FOGARTY 61MM (MISCELLANEOUS) IMPLANT
INSERT FOGARTY SM (MISCELLANEOUS) IMPLANT
INTRAOP MONITOR FEE IMPULS NCS (MISCELLANEOUS) ×1
INTRAOP MONITOR FEE IMPULSE (MISCELLANEOUS) ×2
IV CATH 14GX2 1/4 (CATHETERS) ×3 IMPLANT
KIT BASIN OR (CUSTOM PROCEDURE TRAY) ×3 IMPLANT
KIT POSITION SURG JACKSON T1 (MISCELLANEOUS) ×3 IMPLANT
KIT ROOM TURNOVER OR (KITS) ×6 IMPLANT
LOOP VESSEL MAXI BLUE (MISCELLANEOUS) IMPLANT
LOOP VESSEL MINI RED (MISCELLANEOUS) IMPLANT
MARKER SKIN DUAL TIP RULER LAB (MISCELLANEOUS) ×3 IMPLANT
MIX DBX 10CC 35% BONE (Bone Implant) ×3 IMPLANT
NDL SAFETY ECLIPSE 18X1.5 (NEEDLE) ×1 IMPLANT
NEEDLE 22X1 1/2 (OR ONLY) (NEEDLE) ×3 IMPLANT
NEEDLE HYPO 18GX1.5 SHARP (NEEDLE) ×2
NEEDLE HYPO 25GX1X1/2 BEV (NEEDLE) ×3 IMPLANT
NEEDLE JAMSHIDI VIPER (NEEDLE) ×9 IMPLANT
NEEDLE SPNL 18GX3.5 QUINCKE PK (NEEDLE) ×6 IMPLANT
NS IRRIG 1000ML POUR BTL (IV SOLUTION) ×3 IMPLANT
OIL CARTRIDGE MAESTRO DRILL (MISCELLANEOUS)
PACK LAMINECTOMY ORTHO (CUSTOM PROCEDURE TRAY) ×3 IMPLANT
PACK UNIVERSAL I (CUSTOM PROCEDURE TRAY) ×6 IMPLANT
PAD ARMBOARD 7.5X6 YLW CONV (MISCELLANEOUS) ×12 IMPLANT
PATTIES SURGICAL .5 X1 (DISPOSABLE) ×6 IMPLANT
PATTIES SURGICAL .5X1.5 (GAUZE/BANDAGES/DRESSINGS) ×3 IMPLANT
PUTTY BONE DBX 5CC MIX (Putty) ×3 IMPLANT
ROD VIPER II LORDOSED 5.5X35 (Rod) ×6 IMPLANT
SCREW SET SINGLE INNER MIS (Screw) ×12 IMPLANT
SPONGE GAUZE 4X4 12PLY STER LF (GAUZE/BANDAGES/DRESSINGS) ×6 IMPLANT
SPONGE INTESTINAL PEANUT (DISPOSABLE) ×12 IMPLANT
SPONGE LAP 18X18 X RAY DECT (DISPOSABLE) IMPLANT
SPONGE LAP 4X18 X RAY DECT (DISPOSABLE) IMPLANT
SPONGE SURGIFOAM ABS GEL 100 (HEMOSTASIS) ×6 IMPLANT
STAPLER VISISTAT 35W (STAPLE) IMPLANT
STRIP CLOSURE SKIN 1/2X4 (GAUZE/BANDAGES/DRESSINGS) ×4 IMPLANT
SURGIFLO W/THROMBIN 8M KIT (HEMOSTASIS) IMPLANT
SUT MNCRL AB 4-0 PS2 18 (SUTURE) ×15 IMPLANT
SUT PDS AB 1 CTX 36 (SUTURE) ×6 IMPLANT
SUT PROLENE 4 0 RB 1 (SUTURE) ×8
SUT PROLENE 4-0 RB1 .5 CRCL 36 (SUTURE) ×4 IMPLANT
SUT PROLENE 5 0 C 1 24 (SUTURE) IMPLANT
SUT PROLENE 5 0 CC1 (SUTURE) ×3 IMPLANT
SUT PROLENE 6 0 C 1 30 (SUTURE) ×3 IMPLANT
SUT PROLENE 6 0 CC (SUTURE) IMPLANT
SUT SILK 0 TIES 10X30 (SUTURE) ×3 IMPLANT
SUT SILK 2 0 TIES 10X30 (SUTURE) ×6 IMPLANT
SUT SILK 2 0SH CR/8 30 (SUTURE) IMPLANT
SUT SILK 3 0 TIES 10X30 (SUTURE) ×6 IMPLANT
SUT SILK 3 0SH CR/8 30 (SUTURE) IMPLANT
SUT VIC AB 0 CT1 18XCR BRD 8 (SUTURE) ×1 IMPLANT
SUT VIC AB 0 CT1 27 (SUTURE) ×2
SUT VIC AB 0 CT1 27XBRD ANBCTR (SUTURE) ×1 IMPLANT
SUT VIC AB 0 CT1 8-18 (SUTURE) ×2
SUT VIC AB 1 CT1 18XCR BRD 8 (SUTURE) ×2 IMPLANT
SUT VIC AB 1 CT1 27 (SUTURE) ×4
SUT VIC AB 1 CT1 27XBRD ANBCTR (SUTURE) ×2 IMPLANT
SUT VIC AB 1 CT1 8-18 (SUTURE) ×4
SUT VIC AB 1 CTX 36 (SUTURE) ×4
SUT VIC AB 1 CTX36XBRD ANBCTR (SUTURE) ×2 IMPLANT
SUT VIC AB 2-0 CT1 36 (SUTURE) ×3 IMPLANT
SUT VIC AB 2-0 CT2 18 VCP726D (SUTURE) ×9 IMPLANT
SUT VIC AB 3-0 SH 27 (SUTURE) ×2
SUT VIC AB 3-0 SH 27X BRD (SUTURE) ×1 IMPLANT
SYR 20CC LL (SYRINGE) ×3 IMPLANT
SYR BULB IRRIGATION 50ML (SYRINGE) ×3 IMPLANT
SYR CONTROL 10ML LL (SYRINGE) ×6 IMPLANT
SYR TB 1ML LUER SLIP (SYRINGE) ×3 IMPLANT
TABS EXTENSION VIPER 6X35 (Neuro Prosthesis/Implant) ×4 IMPLANT
TAP CANN VIPER2 DL 5.0 (TAP) ×6 IMPLANT
TAPE CLOTH SURG 6X10 WHT LF (GAUZE/BANDAGES/DRESSINGS) ×3 IMPLANT
TOWEL OR 17X24 6PK STRL BLUE (TOWEL DISPOSABLE) ×3 IMPLANT
TOWEL OR 17X26 10 PK STRL BLUE (TOWEL DISPOSABLE) ×3 IMPLANT
TRAY FOLEY CATH 16FR SILVER (SET/KITS/TRAYS/PACK) IMPLANT
TRAY FOLEY CATH 16FRSI W/METER (SET/KITS/TRAYS/PACK) ×3 IMPLANT
WATER STERILE IRR 1000ML POUR (IV SOLUTION) ×3 IMPLANT
X-TABS VIPER 6X35 (Neuro Prosthesis/Implant) ×12 IMPLANT
YANKAUER SUCT BULB TIP NO VENT (SUCTIONS) ×6 IMPLANT

## 2015-11-18 NOTE — OR Nursing (Signed)
No unexpected foreign bodies detected in XRAY image per Roque LiasJames Green, Radiologist.

## 2015-11-18 NOTE — Anesthesia Procedure Notes (Signed)
Procedure Name: Intubation Date/Time: 11/18/2015 8:54 AM Performed by: Army FossaPULLIAM, Damion Kant DANE Pre-anesthesia Checklist: Patient identified, Emergency Drugs available, Suction available, Patient being monitored and Timeout performed Patient Re-evaluated:Patient Re-evaluated prior to inductionOxygen Delivery Method: Circle system utilized Preoxygenation: Pre-oxygenation with 100% oxygen Intubation Type: IV induction Ventilation: Mask ventilation without difficulty Laryngoscope Size: Mac and 3 Grade View: Grade I Tube type: Oral Tube size: 7.0 mm Number of attempts: 1 Airway Equipment and Method: Patient positioned with wedge pillow and Stylet Placement Confirmation: ETT inserted through vocal cords under direct vision,  positive ETCO2,  CO2 detector and breath sounds checked- equal and bilateral Secured at: 21 cm Tube secured with: Tape Dental Injury: Teeth and Oropharynx as per pre-operative assessment

## 2015-11-18 NOTE — Op Note (Signed)
    OPERATIVE REPORT  DATE OF SURGERY: 11/18/2015  PATIENT: Elizabeth Davies, 30 y.o. female MRN: [id number]  DOB: 06/25/1985  PRE-OPERATIVE DIAGNOSIS: Degenerative disc disease L4-5  POST-OPERATIVE DIAGNOSIS:  Same  PROCEDURE: Anterior exposure for L4-5 disc surgery  SURGEON:  Gretta Beganodd Latoi Giraldo, M.D.  Co-surgeon for the exposure Dr. Yevette Edwardsumonski  ANESTHESIA:  Gen.  EBL: 200 ml  Total I/O In: 3000 [I.V.:3000] Out: 400 [Urine:200; Blood:200]  BLOOD ADMINISTERED: None  DRAINS: None  SPECIMEN: None  COUNTS CORRECT:  YES  PLAN OF CARE: PACU   PATIENT DISPOSITION:  PACU - hemodynamically stable  PROCEDURE DETAILS: Patient was taken to the operative placed supine position where the area of the abdomen was prepped and draped in usual sterile fashion. Lateral C-arm projection was used to identify the level of the L4-5 disc on the anterior abdominal wall. Seizures made from the midline laterally to the left and carried into the subcutaneous fat down to the anterior rectus sheath. Anterior rectus sheath was opened with electrocautery in line with the skin incision. The rectus muscle was mobilized circumferentially. The retro-peritoneal space was entered bluntly and the left lower quadrant and the peritoneal sac was mobilized to the right. The posterior rectus sheath was opened laterally "give continued ability to for mobilization of the anterior abdominal contents and peritoneal sac to the right. The left ureter was identified and mobilized to the right as well. The left iliac artery was bluntly mobilized and the left iliac vein was visualized. The patient had 3 separate iliolumbar veins. These were ligated individually with 2-0 silk ties and divided. Blunt dissection over the L4-5 disc was used to give a complete exposure to the anterior surface. The Thompson retractor was brought onto the field. The 150 reverse lip blades were positioned the right and left of the L4-5 disc and the malleable 140  blade was used for superior and the malleable 190 blade was used for anterior exposure. On retracting the vein tied from the iliolumbar vein was dislodged and this was controlled with a 5-0 Prolene suture. The needle was placed in the L4-5 disc C-arm was brought back onto the field to confirm that this was the L4-5 disc. The remainder the procedure will be dictated as a separate note by Dr.Dumonski   Gretta Beganodd Margrette Wynia, M.D. 11/18/2015 3:15 PM

## 2015-11-18 NOTE — Transfer of Care (Signed)
Immediate Anesthesia Transfer of Care Note  Patient: Elizabeth Davies  Procedure(s) Performed: Procedure(s) with comments: LUMBAR 4-5 ANTERIOR LUMBAR FUSION  (N/A) - LUMBAR 4-5 ANTERIOR LUMBAR FUSION  LUMBAR 4-5 POSTERIOR LUMBAR FUSION WITH INSTRUMENTATION AND ALLOGRAFT (N/A) - LUMBAR 4-5 POSTERIOR LUMBAR FUSION WITH INSTRUMENTATION AND ALLOGRAFT ABDOMINAL EXPOSURE (N/A)  Patient Location: PACU  Anesthesia Type:General  Level of Consciousness:  sedated, patient cooperative and responds to stimulation  Airway & Oxygen Therapy:Patient Spontanous Breathing and Patient connected to face mask oxgen  Post-op Assessment:  Report given to PACU RN and Post -op Vital signs reviewed and stable  Post vital signs:  Reviewed and stable  Last Vitals:  Filed Vitals:   11/18/15 0640  Pulse: 74  Temp: 37 C  Resp: 18    Complications: No apparent anesthesia complications

## 2015-11-18 NOTE — Op Note (Addendum)
NAMGolden Hurter:  Davies, Elizabeth Davies            ACCOUNT NO.:  [id number]  MEDICAL RECORD NO.:  [id number]  LOCATION:  [id number]                        FACILITY:  [facility]  PHYSICIAN:  Estill BambergMark Aisa Schoeppner, MD      DATE OF BIRTH:  09/26/1985  DATE OF PROCEDURE:  11/18/2015                              OPERATIVE REPORT   PREOPERATIVE DIAGNOSES: 1. L4-5 degenerative disk disease. 2. Discogenic low back pain associated with L4-5 degenerative disk     disease. 3. Positive diskogram notable for concordant pain associated with the     L4-5 level.  POSTOPERATIVE DIAGNOSES: 1. L4-5 degenerative disk disease. 2. Discogenic low back pain associated with L4-5 degenerative disk     disease. 3. Positive diskogram notable for concordant pain associated with the     L4-5 level.  PROCEDURE: 1. Anterior lumbar interbody fusion, L4-5. 2. Insertion of interbody device x1 (small, 14 mm, 10 degree, Cougar     intervertebral spacer). 3. Placement of anterior instrumentation, L4-5. 4. Use of morselized allograft-DBS mix. 5. Intraoperative use of fluoroscopy. 6. Posterior spinal fusion, L4-5. 7. Placement of posterior instrumentation L4, L5. 8. First assistant for anterior retroperitoneal exposure by Dr. Tawanna Coolerodd     Early as well as procedure.  SURGEON:  Estill BambergMark Lamine Laton, MD  ASSISTANT:  Jason CoopKayla McKenzie, PA-C  ANESTHESIA:  General endotracheal anesthesia.  COMPLICATIONS:  None.  DISPOSITION:  Stable.  ESTIMATED BLOOD LOSS:  Minimal.  INDICATIONS FOR SURGERY:  Briefly, [name] is a pleasant 30 year old female, who did present to me on July 15, 2015, with ongoing and debilitating pain in her low back, status post a work injury that did occur on June 25, 2014.  The patient's pain was consistent with discogenic low back pain.  An MRI did reveal degenerative disk disease at L4-5.  Given the patient's ongoing pain, we did proceed with a discogram.  It was identified during on the diskogram report.  The L4-5 level  was associated with (severe concordant back pain and bilateral leg pain, unquestionable).  Given this, we did discuss proceeding with the procedure reflected above.  The patient was fully aware of the risks and limitations of surgery.  Of particular note, she did understand that surgery was not guarantee of resolution of her low back pain, but did bring with it a 70-80% chance of resolution of her low back pain.  She did elect to proceed.  OPERATIVE DETAILS:  On November 18, 2015, the patient was brought to surgery and general endotracheal anesthesia was administered.  The patient was placed supine on the hospital bed.  All bony prominences were padded. The abdomen was prepped and draped in the usual sterile fashion and an anterior retroperitoneal exposure was then performed by Dr. Gretta Beganodd Early. I did function as his first assistant for the exposure.  Once the L4-5 intervertebral space was identified, I did perform a thorough and complete L4-5 intervertebral diskectomy.  I then placed a series of trials and I did select a most appropriate intervertebral spacer, which was packed with allograft and tamped into position in the usual fashion. I did use intraoperative fluoroscopy in order to optimize appropriate placement of the implant.  I then proceeded with anterior instrumentation.  I did place an anterior screw with buttress fixation over the anterior intervertebral space at the L4-5 level, to help additionally provide stability and to eliminate any chance of anterior migration of the intervertebral implant.    Of note, this instrumentation was not associated with the intervertebral biomechanical device, and was separate, not a part of the previously placed spacer.    The wound was then copiously irrigated.  The fascia was then closed using #1 PDS.  The wound was then closed with 0 Vicryl, followed by 2-0 Vicryl, followed by 4-0 Monocryl. Benzoin and Steri-Strips were applied.  The patient  was then rolled prone onto a Jackson spinal bed.  Paramedian incisions were then made lateral to the pedicles on the left and right sides.  I did expose the posterolateral gutter on the left side and I did proceed with decortication of the posterior elements on the left.  The remainder of the DBS mix was packed into the posterolateral gutter to help aid in the success of the fusion.  I then cannulated the L4 and L5 pedicles using a 5 mm tap.  The tap was then tested using triggered EMG and there was no tap that tested below 12 milliamps.  I then placed 6 x 35 mm screws bilaterally at L4 and at L5.  A 35 mm rod was secured into the tulip heads of the screws.  Caps were placed and a final locking procedure was performed.  I was very pleased with the final AP and lateral fluoroscopic images.  I was very pleased with the fixation.  The wound was then copiously irrigated.  The wound was then closed in layers using #1 Vicryl followed by 2-0 Vicryl, followed by 3-0 Monocryl.  Benzoin and Steri-Strips were then applied followed by sterile dressing.  Of note, I did use neurologic monitoring throughout the surgery and there was no sustained abnormal EMG activity noted.  Of note, Jason Coop was my assistant throughout surgery, including in assisting in the anterior and posterior portions of the procedure. She was essential in retraction, suctioning, and closure.     Estill Bamberg, MD     MD/MEDQ  D:  11/18/2015  T:  11/18/2015  Job:  [id number]

## 2015-11-18 NOTE — Anesthesia Postprocedure Evaluation (Signed)
Anesthesia Post Note  Patient: Elizabeth Davies  Procedure(s) Performed: Procedure(s) (LRB): LUMBAR 4-5 ANTERIOR LUMBAR FUSION  (N/A) LUMBAR 4-5 POSTERIOR LUMBAR FUSION WITH INSTRUMENTATION AND ALLOGRAFT (N/A) ABDOMINAL EXPOSURE (N/A)  Patient location during evaluation: PACU Anesthesia Type: General Level of consciousness: awake and alert Pain management: pain level controlled Vital Signs Assessment: post-procedure vital signs reviewed and stable Respiratory status: spontaneous breathing, nonlabored ventilation, respiratory function stable and patient connected to nasal cannula oxygen Cardiovascular status: blood pressure returned to baseline and stable Postop Assessment: no signs of nausea or vomiting Anesthetic complications: no    Last Vitals:  Filed Vitals:   11/18/15 1430 11/18/15 1445  BP: 134/78 123/83  Pulse: 81 77  Temp:    Resp: 12 14    Last Pain:  Filed Vitals:   11/18/15 1454  PainSc: 8                  Chandler Swiderski,W. EDMOND

## 2015-11-19 MED ORDER — HYDROMORPHONE HCL 1 MG/ML IJ SOLN
1.0000 mg | INTRAMUSCULAR | Status: DC | PRN
  Administered 2015-11-19 (×2): 1 mg via INTRAVENOUS
  Filled 2015-11-19 (×3): qty 1

## 2015-11-19 MED FILL — Thrombin For Soln 20000 Unit: CUTANEOUS | Qty: 1 | Status: AC

## 2015-11-19 NOTE — Evaluation (Signed)
Occupational Therapy Evaluation Patient Details Name: Acquanetta Cabanilla MRN: [id number] DOB: [birth year] Today's Date: 11/19/2015    History of Present Illness Anterior lumbar interbody fusion, L4-5.   Clinical Impression   Pt with decline in function and safety with ADLs and ADL mobility with decreased strength and balance. Pt is limited by pain, dizziness and nausea. Pt had to be put back to bed after only sitting in recliner 10 minutes preparing to eat lunch. Pt's BP was 102/54 and her nurse was notified and came in to see pt. Pt would benefit from acute OT services to address impairments to increase level of function and safety to return home    Follow Up Recommendations  Home health OT;Supervision/Assistance - 24 hour    Equipment Recommendations  3 in 1 bedside comode;Tub/shower bench    Recommendations for Other Services       Precautions / Restrictions Precautions Precautions: Back Precaution Booklet Issued: Yes (comment) Precaution Comments: pt provided with back precautions handout PTA, reviewed back precautions with pt Required Braces or Orthoses: Spinal Brace Spinal Brace: Thoracolumbosacral orthotic;Applied in sitting position Restrictions Weight Bearing Restrictions: No      Mobility Bed Mobility Overal bed mobility: Needs Assistance Bed Mobility: Rolling;Sidelying to Sit;Sit to Sidelying     Supine to sit: Mod assist Sit to supine: Mod assist;+2 for physical assistance;+2 for safety/equipment Sit to sidelying: Total assist;+2 for physical assistance General bed mobility comments: pt with increased pain, dizziness, nasuea and required being put back to bed after ambulation in hallway and sititng in recliner for a few minutes. BP 102/54, O2 SATs 98%  Transfers Overall transfer level: Needs assistance Equipment used: Rolling walker (2 wheeled) Transfers: Sit to/from Stand Sit to Stand: Min assist         General transfer comment: cues for transition  position, adherenct to back precautions and use of UEs to self assist    Balance Overall balance assessment: Needs assistance Sitting-balance support: No upper extremity supported;Feet supported Sitting balance-Leahy Scale: Fair     Standing balance support: Bilateral upper extremity supported;During functional activity Standing balance-Leahy Scale: Fair                              ADL Overall ADL's : Needs assistance/impaired     Grooming: Wash/dry hands;Wash/dry face;Minimal assistance;Standing   Upper Body Bathing: Minimal assitance   Lower Body Bathing: Maximal assistance   Upper Body Dressing : Minimal assistance   Lower Body Dressing: Total assistance   Toilet Transfer: Minimal assistance;Ambulation;RW Toilet Transfer Details (indicate cue type and reason): simulated Toileting- Clothing Manipulation and Hygiene: Total assistance       Functional mobility during ADLs: Minimal assistance General ADL Comments: pt and her husband provided with education and demo of ADL A/E for home use     Vision  no visual deficits reported by pt, no change in baseline              Pertinent Vitals/Pain Pain Assessment: 0-10 Pain Score: 8  Faces Pain Scale: Hurts whole lot Pain Location: back, with mobility Pain Descriptors / Indicators: Aching;Sore;Guarding;Grimacing Pain Intervention(s): Limited activity within patient's tolerance;Monitored during session;Repositioned;Premedicated before session     Hand Dominance Right   Extremity/Trunk Assessment Upper Extremity Assessment Upper Extremity Assessment: Overall WFL for tasks assessed   Lower Extremity Assessment Lower Extremity Assessment: Defer to PT evaluation       Communication Communication Communication: No difficulties;Prefers language other than [place] ([place]  is first language)   Cognition Arousal/Alertness: Awake/alert Behavior During Therapy: WFL for tasks assessed/performed Overall  Cognitive Status: Within Functional Limits for tasks assessed                     General Comments   pt pleasant and cooperative, pt's husband very supportive                 Home Living Family/patient expects to be discharged to:: Private residence Living Arrangements: Spouse/significant other Available Help at Discharge: Family Type of Home: Mobile home Home Access: Stairs to enter Entrance Stairs-Number of Steps: 2 in back, 6 in front Entrance Stairs-Rails: Right Home Layout: One level     Bathroom Shower/Tub: Chief Strategy OfficerTub/shower unit   Bathroom Toilet: Standard     Home Equipment: None   Additional Comments: ocassional assist to bathe feet, donn socks and shoes due to back pain      Prior Functioning/Environment Level of Independence: Independent             OT Diagnosis: Acute pain   OT Problem List: Decreased activity tolerance;Impaired balance (sitting and/or standing);Pain;Decreased knowledge of precautions;Decreased knowledge of use of DME or AE   OT Treatment/Interventions: Self-care/ADL training;Patient/family education;Therapeutic activities;DME and/or AE instruction    OT Goals(Current goals can be found in the care plan section) Acute Rehab OT Goals Patient Stated Goal: move with less pain OT Goal Formulation: With patient/family Time For Goal Achievement: 11/26/15 Potential to Achieve Goals: Good ADL Goals Pt Will Perform Grooming: with min guard assist;standing Pt Will Perform Upper Body Bathing: with set-up;with supervision;with caregiver independent in assisting;sitting Pt Will Perform Lower Body Bathing: with mod assist;with caregiver independent in assisting;sitting/lateral leans;sit to/from stand Pt Will Perform Upper Body Dressing: with supervision;with set-up;with caregiver independent in assisting;sitting Pt Will Perform Lower Body Dressing: with adaptive equipment;with caregiver independent in assisting;with max assist;with mod  assist;sit to/from stand Pt Will Transfer to Toilet: with min guard assist;with supervision;ambulating;grab bars (3 in 1 over toilet) Pt Will Perform Toileting - Clothing Manipulation and hygiene: with max assist;with mod assist;with caregiver independent in assisting;with adaptive equipment;sitting/lateral leans;sit to/from stand Pt Will Perform Tub/Shower Transfer: with min guard assist;with supervision;tub bench  OT Frequency: Min 2X/week   Barriers to D/C:    none       Co-evaluation PT/OT/SLP Co-Evaluation/Treatment: Yes Reason for Co-Treatment: For patient/therapist safety PT goals addressed during session: Mobility/safety with mobility OT goals addressed during session: ADL's and self-care      End of Session Equipment Utilized During Treatment: Rolling walker;Back brace Nurse Communication: Mobility status;Other (comment) (pt bacmae dizzy, naseous and over heated at end of session)  Activity Tolerance: Patient limited by pain;Other (comment) (dizziness, nauseous) Patient left: in chair;with call bell/phone within reach;with family/visitor present;with nursing/sitter in room   Time: 1152-1230 OT Time Calculation (min): 38 min Charges:  OT General Charges $OT Visit: 1 Procedure OT Evaluation $OT Eval Moderate Complexity: 1 Procedure OT Treatments $Therapeutic Activity: 8-22 mins G-Codes:    Galen ManilaSpencer, Cassidy Tabet Jeanette 11/19/2015, 2:22 PM

## 2015-11-19 NOTE — Progress Notes (Signed)
Physical Therapy Treatment Patient Details Name: Elizabeth Davies MRN: [id number] DOB: 10-25-1985 Today's Date: 11/19/2015    History of Present Illness Anterior lumbar interbody fusion, L4-5.    PT Comments    Pt motivated to progress but ltd this pm by pain level with bed mobility and ongoing nausea.  Pt currently requiring heavy mod assist to transition supine<>sit.  Pt and spouse also expressing concerns re: pt's very limited sitting tolerance and car transport to home outside of [place].  Pt could greatly benefit from additional day in acute care to to further address mobility deficits and assist with safe transition home  Follow Up Recommendations  No PT follow up     Equipment Recommendations  Rolling walker with 5" wheels    Recommendations for Other Services OT consult     Precautions / Restrictions Precautions Precautions: Back Precaution Booklet Issued: Yes (comment) Precaution Comments: pt provided with back precautions handout PTA, reviewed back precautions with pt Required Braces or Orthoses: Spinal Brace Spinal Brace: Thoracolumbosacral orthotic;Applied in sitting position Restrictions Weight Bearing Restrictions: No    Mobility  Bed Mobility Overal bed mobility: Needs Assistance Bed Mobility: Rolling;Sidelying to Sit;Sit to Sidelying Rolling: Mod assist Sidelying to sit: Mod assist Supine to sit: Mod assist Sit to supine: Mod assist Sit to sidelying: Mod assist General bed mobility comments: cues for correct log roll technique.  Heavy mod assist required 2* pain  Transfers Overall transfer level: Needs assistance Equipment used: Rolling walker (2 wheeled) Transfers: Sit to/from Stand Sit to Stand: Min assist;Mod assist         General transfer comment: cues for transition position, adherence to back precautions and use of UEs to self assist  Ambulation/Gait Ambulation/Gait assistance: Min assist;+2 safety/equipment Ambulation Distance (Feet):  75 Feet (twice) Assistive device: Rolling walker (2 wheeled) Gait Pattern/deviations: Step-through pattern;Decreased step length - right;Decreased step length - left;Shuffle;Narrow base of support Gait velocity: decr Gait velocity interpretation: Below normal speed for age/gender General Gait Details: cues for posture, position from RW and to increase BOS   Stairs Stairs: Yes Stairs assistance: Min assist Stair Management: Step to pattern;Two rails;Forwards Number of Stairs: 2 General stair comments: cues for sequence and foot placement.  Spouse assisting on descent  Wheelchair Mobility    Modified Rankin (Stroke Patients Only)       Balance Overall balance assessment: Needs assistance Sitting-balance support: No upper extremity supported Sitting balance-Leahy Scale: Fair     Standing balance support: Bilateral upper extremity supported Standing balance-Leahy Scale: Fair                      Cognition Arousal/Alertness: Awake/alert Behavior During Therapy: WFL for tasks assessed/performed Overall Cognitive Status: Within Functional Limits for tasks assessed                      Exercises      General Comments        Pertinent Vitals/Pain Pain Assessment: 0-10 Pain Score: 8  Faces Pain Scale: Hurts whole lot Pain Location: back with transfers supine<>sit Pain Descriptors / Indicators: Aching;Grimacing;Moaning;Sore Pain Intervention(s): Limited activity within patient's tolerance;Monitored during session;Patient requesting pain meds-RN notified    Home Living Family/patient expects to be discharged to:: Private residence Living Arrangements: Spouse/significant other Available Help at Discharge: Family Type of Home: Mobile home Home Access: Stairs to enter Entrance Stairs-Rails: Right Home Layout: One level Home Equipment: None Additional Comments: ocassional assist to bathe feet, donn socks and shoes due  to back pain    Prior Function  Level of Independence: Independent          PT Goals (current goals can now be found in the care plan section) Acute Rehab PT Goals Patient Stated Goal: move with less pain PT Goal Formulation: With patient Time For Goal Achievement: 11/21/15 Potential to Achieve Goals: Good Progress towards PT goals: Progressing toward goals    Frequency  Min 5X/week    PT Plan Current plan remains appropriate    Co-evaluation PT/OT/SLP Co-Evaluation/Treatment: Yes Reason for Co-Treatment: For patient/therapist safety PT goals addressed during session: Mobility/safety with mobility OT goals addressed during session: ADL's and self-care     End of Session Equipment Utilized During Treatment: Back brace Activity Tolerance: Patient limited by pain;Other (comment) (nausea) Patient left: in bed;with call bell/phone within reach;with family/visitor present     Time: [id number] PT Time Calculation (min) (ACUTE ONLY): 31 min  Charges:  $Gait Training: 23-37 mins                    G Codes:      Landa Mullinax 11/19/2015, 2:55 PM

## 2015-11-19 NOTE — Progress Notes (Signed)
Attempted to call Lanny CrampKayla McKenzi to report that patient and her husband does not want her to go home at this time still c/o nausea and having a lot of pain . Patient was able to tolerate her regular diet and please see PT evening note. Waiting to hear from MD/ left message/ answering service/[phone]

## 2015-11-19 NOTE — Progress Notes (Signed)
    Patient doing well + expected LBP, currently minimal Has not been OOB yet   Physical Exam: Filed Vitals:   11/19/15 0052 11/19/15 0513  BP: 107/63 108/61  Pulse: 78 92  Temp: 98.4 F (36.9 C) 98.8 F (37.1 C)  Resp: 16 14   Patient looks excellent Dressing in place NVI  POD #1 s/p A/P lumbar fusion, doing well  - up with PT/OT, encourage ambulation - Percocet for pain, Valium for muscle spasms - likely d/c home today

## 2015-11-19 NOTE — Progress Notes (Signed)
Patient sleeping . Regular diet as ordered by MD. Will check later with PT to see how patient is doing

## 2015-11-19 NOTE — Care Management Note (Signed)
Case Management Note  Patient Details  Name: Elizabeth Davies MRN: [id number] Date of Birth: 02/02/1986  Subjective/Objective:    30 yr old female s/p Anterior lumbar interbody fusion, L4-5.             Action/Plan: Patient is under worker's compensation. Her adjuster is AES CorporationBlair Teal with [employer]. Carlena SaxBlair provided contact information for Case manager Danelle BerryMelissa Etchbverry [phone], Fax [phone]. Case manager faxed orders for HHOT, Rolling walker, 3in1 and tub bench to her. Will continue to follow.   Expected Discharge Date:   11/20/15               Expected Discharge Plan:  Home w Home Health Services  In-House Referral:     Discharge planning Services  CM Consult  Post Acute Care Choice:  Durable Medical Equipment Choice offered to:  NA (patient under worker's comp. they will arrange)  DME Arranged:  3-N-1, Walker rolling, Tub bench DME Agency:   (to be arranged by worker's comp)  HH Arranged:  OT, PT HH Agency:   (to be aranged by worker's comp)  Status of Service:  In process, will continue to follow  Medicare Important Message Given:    Date Medicare IM Given:    Medicare IM give by:    Date Additional Medicare IM Given:    Additional Medicare Important Message give by:     If discussed at Long Length of Stay Meetings, dates discussed:    Additional Comments:  Durenda GuthrieBrady, Elizabeth Toon Naomi, RN 11/19/2015, 3:21 PM

## 2015-11-19 NOTE — Evaluation (Signed)
Physical Therapy Evaluation Patient Details Name: Elizabeth Davies MRN: [id number] DOB: 1985/11/11 Today's Date: 11/19/2015   History of Present Illness  Anterior lumbar interbody fusion, L4-5.  Clinical Impression  Pt s/p L4-5 fusion presents with functional mobility limitations 2* post op pain and back precautions.  Pt should progress to dc home with family assist     Follow Up Recommendations No PT follow up    Equipment Recommendations  Rolling walker with 5" wheels    Recommendations for Other Services OT consult     Precautions / Restrictions Precautions Precautions: Back Precaution Booklet Issued: Yes (comment) Precaution Comments: pt provided with back precautions handout PTA, reviewed back precautions with pt Required Braces or Orthoses: Spinal Brace Spinal Brace: Thoracolumbosacral orthotic;Applied in sitting position Restrictions Weight Bearing Restrictions: No      Mobility  Bed Mobility Overal bed mobility: Needs Assistance Bed Mobility: Rolling;Sidelying to Sit;Sit to Sidelying     Supine to sit: Mod assist Sit to supine: Mod assist;+2 for physical assistance;+2 for safety/equipment Sit to sidelying: Total assist;+2 for physical assistance General bed mobility comments: pt with increased pain, dizziness, nasuea and required being put back to bed after ambulation in hallway and sititng in recliner for a few minutes. BP 102/54, O2 SATs 98%  Transfers Overall transfer level: Needs assistance Equipment used: Rolling walker (2 wheeled) Transfers: Sit to/from Stand Sit to Stand: Min assist         General transfer comment: cues for transition position, adherenct to back precautions and use of UEs to self assist  Ambulation/Gait Ambulation/Gait assistance: Min assist;+2 safety/equipment Ambulation Distance (Feet): 150 Feet Assistive device: Rolling walker (2 wheeled) Gait Pattern/deviations: Step-to pattern;Step-through pattern;Decreased step length -  right;Decreased step length - left;Shuffle;Trunk flexed Gait velocity: decr Gait velocity interpretation: Below normal speed for age/gender General Gait Details: cues for sequence, posture and position from [employer]            Wheelchair Mobility    Modified Rankin (Stroke Patients Only)       Balance Overall balance assessment: Needs assistance Sitting-balance support: No upper extremity supported;Feet supported Sitting balance-Leahy Scale: Fair     Standing balance support: Bilateral upper extremity supported;During functional activity Standing balance-Leahy Scale: Fair                               Pertinent Vitals/Pain Pain Assessment: 0-10 Pain Score: 8  Faces Pain Scale: Hurts whole lot Pain Location: back, with mobility Pain Descriptors / Indicators: Aching;Sore;Guarding;Grimacing Pain Intervention(s): Limited activity within patient's tolerance;Monitored during session;Repositioned;Premedicated before session    Home Living Family/patient expects to be discharged to:: Private residence Living Arrangements: Spouse/significant other Available Help at Discharge: Family Type of Home: Mobile home Home Access: Stairs to enter Entrance Stairs-Rails: Right Entrance Stairs-Number of Steps: 2 in back, 6 in front Home Layout: One level Home Equipment: None Additional Comments: ocassional assist to bathe feet, donn socks and shoes due to back pain    Prior Function Level of Independence: Independent               Hand Dominance   Dominant Hand: Right    Extremity/Trunk Assessment   Upper Extremity Assessment: Overall WFL for tasks assessed           Lower Extremity Assessment: Defer to PT evaluation         Communication   Communication: No difficulties;Prefers language other than English (Spanish is first language)  Cognition Arousal/Alertness: Awake/alert Behavior During Therapy: WFL for tasks assessed/performed Overall  Cognitive Status: Within Functional Limits for tasks assessed                      General Comments      Exercises        Assessment/Plan    PT Assessment Patient needs continued PT services  PT Diagnosis Difficulty walking   PT Problem List Decreased strength;Decreased activity tolerance;Decreased balance;Decreased mobility;Decreased knowledge of use of DME;Pain;Decreased knowledge of precautions  PT Treatment Interventions DME instruction;Gait training;Stair training;Functional mobility training;Therapeutic activities;Therapeutic exercise;Patient/family education   PT Goals (Current goals can be found in the Care Plan section) Acute Rehab PT Goals Patient Stated Goal: move with less pain PT Goal Formulation: With patient Time For Goal Achievement: 11/21/15 Potential to Achieve Goals: Good    Frequency Min 5X/week   Barriers to discharge        Co-evaluation PT/OT/SLP Co-Evaluation/Treatment: Yes Reason for Co-Treatment: For patient/therapist safety PT goals addressed during session: Mobility/safety with mobility OT goals addressed during session: ADL's and self-care       End of Session Equipment Utilized During Treatment: Back brace Activity Tolerance: Other (comment) (dizziness and nausea at end of session) Patient left: in bed;with call bell/phone within reach;with family/visitor present Nurse Communication: Mobility status         Time: [id number] PT Time Calculation (min) (ACUTE ONLY): 40 min   Charges:   PT Evaluation $PT Eval Moderate Complexity: 1 Procedure     PT G Codes:        Elizabeth Davies 11/19/2015, 2:15 PM

## 2015-11-19 NOTE — Progress Notes (Signed)
  [facility] Progress Note  Subjective  - POD #1  Mild pain at abdominal incision  Objective Filed Vitals:   11/19/15 0052 11/19/15 0513  BP: 107/63 108/61  Pulse: 78 92  Temp: 98.4 F (36.9 C) 98.8 F (37.1 C)  Resp: 16 14    Intake/Output Summary (Last 24 hours) at 11/19/15 1033 Last data filed at 11/18/15 2003  Gross per 24 hour  Intake   2050 ml  Output    350 ml  Net   1700 ml   Active bowel sounds Abdomen soft 2+ DP pulses bilaterally  Assessment/Planning: 30 y.o. female is s/p: anterior exposure for L4-L5 disc surgery 1 Day Post-Op   Doing well this am.  Ok to d/c from vascular standpoint. Will sign off. Call if needed.   Raymond GurneyKimberly A Lashana Spang 11/19/2015 10:33 AM --  Laboratory CBC    Component Value Date/Time   WBC 8.4 11/12/2015 1148   HGB 13.4 11/12/2015 1148   HCT 40.5 11/12/2015 1148   PLT 334 11/12/2015 1148    BMET    Component Value Date/Time   NA 138 11/12/2015 1148   K 3.6 11/12/2015 1148   CL 106 11/12/2015 1148   CO2 25 11/12/2015 1148   GLUCOSE 97 11/12/2015 1148   BUN 14 11/12/2015 1148   CREATININE 0.65 11/12/2015 1148   CALCIUM 9.4 11/12/2015 1148   GFRNONAA >60 11/12/2015 1148   GFRAA >60 11/12/2015 1148    COAG Lab Results  Component Value Date   INR 1.12 11/12/2015   No results found for: PTT  Antibiotics Anti-infectives    Start     Dose/Rate Route Frequency Ordered Stop   11/18/15 1845  ceFAZolin (ANCEF) IVPB 1 g/50 mL premix     1 g 100 mL/hr over 30 Minutes Intravenous Every 8 hours 11/18/15 1744 11/19/15 0402   11/18/15 1136  ceFAZolin (ANCEF) 2-4 GM/100ML-% IVPB    Comments:  Donnal MoatPulliam, Nicholas   : cabinet override      11/18/15 1136 11/18/15 1246   11/18/15 0800  ceFAZolin (ANCEF) IVPB 2g/100 mL premix     2 g 200 mL/hr over 30 Minutes Intravenous To ShortStay Surgical 11/17/15 1115 11/18/15 0854       Maris BergerKimberly Labrea Eccleston, PA-C [facility] Office: [phone] Pager:  [phone] 11/19/2015 10:33 AM

## 2015-11-20 NOTE — Progress Notes (Signed)
Occupational Therapy Treatment Patient Details Name: Elizabeth Davies MRN: [id number] DOB: 12-09-1985 Today's Date: 11/20/2015    History of present illness Anterior lumbar interbody fusion, L4-5.   OT comments  Pt making progress with functional goals. Pt doing much better today with decreased reports of pain during mobility. Pt scheduled to d/c home this afternoon  Follow Up Recommendations  Home health OT;Supervision/Assistance - 24 hour    Equipment Recommendations  3 in 1 bedside comode;Tub/shower bench;Other (comment) (ADL A/E kit)    Recommendations for Other Services      Precautions / Restrictions Precautions Precautions: Back Precaution Booklet Issued: Yes (comment) Precaution Comments: pt able to recall 3/3 back precautions Required Braces or Orthoses: Spinal Brace Spinal Brace: Thoracolumbosacral orthotic;Applied in sitting position Restrictions Weight Bearing Restrictions: No       Mobility Bed Mobility Overal bed mobility: Needs Assistance Bed Mobility: Rolling;Sidelying to Sit;Sit to Sidelying Rolling: Supervision Sidelying to sit: Min guard     Sit to sidelying: Min guard General bed mobility comments: Required decreased assist with improved log roll technique.    Transfers Overall transfer level: Needs assistance Equipment used: Rolling walker (2 wheeled) Transfers: Sit to/from Stand Sit to Stand: Min guard         General transfer comment: cues for transition position, adherence to back precautions and use of UEs to self assist, husband supported patient.  Seems to be over protective with  her mobility.      Balance Overall balance assessment: Needs assistance   Sitting balance-Leahy Scale: Good       Standing balance-Leahy Scale: Fair                     ADL       Grooming: Wash/dry hands;Wash/dry face;Supervision/safety;Set up;Sitting   Upper Body Bathing: Set up;Supervision/ safety;Sitting Upper Body Bathing Details  (indicate cue type and reason): assist wash back Lower Body Bathing: Moderate assistance   Upper Body Dressing : Supervision/safety;Set up;Sitting   Lower Body Dressing: Maximal assistance Lower Body Dressing Details (indicate cue type and reason): assit to initiate donning underwear and pants up to knees, pt then able to pull up clothing standing at RW level Toilet Transfer: Min guard   Toileting- Clothing Manipulation and Hygiene: Minimal assistance   Tub/ Shower Transfer: Min guard;Rolling walker   Functional mobility during ADLs: Min guard General ADL Comments: Assisted pt to get dressed this morning in prep for pending d/c. pt wanted to get back in bed and not wear back brace      Vision  no change from baseline                              Cognition   Behavior During Therapy: [facility] for tasks assessed/performed Overall Cognitive Status: Within Functional Limits for tasks assessed                       Extremity/Trunk Assessment   WFL                        General Comments  pt pleasant and cooperative, husband very supportive    Pertinent Vitals/ Pain       Pain Assessment: 0-10 Pain Score: 3  Pain Location: back with bed mobility Pain Descriptors / Indicators: Sore;Guarding;Grimacing Pain Intervention(s): Monitored during session;Premedicated before session;Repositioned  Home Living  with husband in mobile home  Prior Functioning/Environment  independent           Frequency Min 2X/week     Progress Toward Goals  OT Goals(current goals can now be found in the care plan section)  Progress towards OT goals: Progressing toward goals  Acute Rehab OT Goals Patient Stated Goal: move with less pain  Plan Discharge plan remains appropriate                     End of Session Equipment Utilized During Treatment: Rolling walker;Back brace (3 in 1)   Activity  Tolerance Patient tolerated treatment well   Patient Left with call bell/phone within reach;with family/visitor present;in bed             Time: 2904-7533 OT Time Calculation (min): 18 min  Charges: OT General Charges $OT Visit: 1 Procedure OT Treatments $Self Care/Home Management : 8-22 mins  Britt Bottom 11/20/2015, 1:30 PM

## 2015-11-20 NOTE — Progress Notes (Signed)
    Patient doing well, has been up with therapy yesterday and walking, nausea is resolved, tolerating meds well, pain well controlled, eating well, NL B/B function    Physical Exam: BP 128/72 mmHg  Pulse 87  Temp(Src) 98.2 F (36.8 C) (Oral)  Resp 16  Ht 5\' 4"  (1.626 m)  Wt 83.507 kg (184 lb 1.6 oz)  BMI 31.59 kg/m2  SpO2 99%  LMP 11/17/2015 (Approximate)  Dressing in place, CDI, Pt resting in bed comfortably, SCD's in place NVI  POD #2 s/p A/P lumbar fusion, doing well  - up with PT/OT, encourage ambulation  -D/C after PT - Percocet for pain, Valium for muscle spasms - likely d/c home today after PT and eating - Up and ambulate in hallways

## 2015-11-20 NOTE — Progress Notes (Signed)
Physical Therapy Treatment Patient Details Name: Elizabeth Davies MRN: [id number] DOB: 03/27/1986 Today's Date: 11/20/2015    History of Present Illness      PT Comments    Pt performed increased mobility with decreased pain and ready for d/c home. Spouse very supportive and pt/spouse able to donn TLSO correctly.  Educated patient on precautions and correct use of TLSO.  Pt happy with her progress.    Follow Up Recommendations  No PT follow up     Equipment Recommendations  Rolling walker with 5" wheels    Recommendations for Other Services       Precautions / Restrictions Precautions Precautions: Back Precaution Booklet Issued: Yes (comment) Precaution Comments: pt provided with back precautions handout PTA, reviewed back precautions with pt Required Braces or Orthoses: Spinal Brace Spinal Brace: Thoracolumbosacral orthotic;Applied in sitting position Restrictions Weight Bearing Restrictions: No    Mobility  Bed Mobility Overal bed mobility: Needs Assistance Bed Mobility: Rolling;Sidelying to Sit Rolling: Supervision Sidelying to sit: Min guard (with rail.  )       General bed mobility comments: Required decreased assist with improved technique.    Transfers Overall transfer level: Needs assistance Equipment used: Rolling walker (2 wheeled) Transfers: Sit to/from Stand Sit to Stand: Min guard         General transfer comment: cues for transition position, adherence to back precautions and use of UEs to self assist, husband supported patient.  Seems to be over protective with  her mobility.    Ambulation/Gait Ambulation/Gait assistance: Supervision Ambulation Distance (Feet): 250 Feet Assistive device: Rolling walker (2 wheeled) Gait Pattern/deviations: Step-through pattern;Decreased stride length Gait velocity: decr   General Gait Details: cues for posture, position from RW and to increase BOS   Stairs Stairs: Yes Stairs assistance: Min assist  (Husband supported performing HHA on L ascending and R descending.  ) Stair Management: One rail Right;One rail Left;Forwards;Step to pattern (R ascending and L descending.  ) Number of Stairs: 2 General stair comments: cues for sequence and foot placement.  Spouse assisting on descent  Wheelchair Mobility    Modified Rankin (Stroke Patients Only)       Balance Overall balance assessment: Needs assistance   Sitting balance-Leahy Scale: Good       Standing balance-Leahy Scale: Fair                      Cognition Arousal/Alertness: Awake/alert Behavior During Therapy: WFL for tasks assessed/performed Overall Cognitive Status: Within Functional Limits for tasks assessed                      Exercises      General Comments        Pertinent Vitals/Pain Pain Assessment: 0-10 Pain Score: 2  Pain Descriptors / Indicators: Grimacing;Guarding;Sore Pain Intervention(s): Monitored during session;Repositioned    Home Living                      Prior Function            PT Goals (current goals can now be found in the care plan section) Acute Rehab PT Goals Patient Stated Goal: move with less pain Potential to Achieve Goals: Good Progress towards PT goals: Progressing toward goals    Frequency  Min 5X/week    PT Plan Current plan remains appropriate    Co-evaluation             End of Session Equipment Utilized  During Treatment: Back brace Activity Tolerance: Patient limited by pain;Other (comment) (nausea) Patient left: in bed;with call bell/phone within reach;with family/visitor present     Time: 9147-8295 PT Time Calculation (min) (ACUTE ONLY): 21 min  Charges:  $Gait Training: 8-22 mins                    G Codes:      Florestine Avers 12/10/15, 9:47 AM  Joycelyn Rua, PTA pager [phone]

## 2015-11-20 NOTE — Care Management (Signed)
Case manager received call from Sawyer[place]sey with [employer], company that arranges DME and [facility] [employer]. She states she will contact [facility] office to arrange for DME to be delivered to patient's room. CM provided [place] with necessary phone numbers.

## 2015-11-20 NOTE — Progress Notes (Signed)
Interpreter Wyvonnia DuskyGraciela Namihira for RN [facility]. Discharge instructions

## 2015-11-23 ENCOUNTER — Encounter (HOSPITAL_COMMUNITY): Payer: Self-pay | Admitting: Orthopedic Surgery

## 2015-11-26 NOTE — Discharge Summary (Signed)
Patient ID: Elizabeth Davies MRN: [id number] DOB/AGE: 32/06/1985 31 y.o.  Admit date: 11/18/2015 Discharge date: 11/19/2015  Admission Diagnoses:  Active Problems:   Degenerative disc disease, lumbar   Discharge Diagnoses:  Same  Past Medical History  Diagnosis Date  . Asthma     pt. denies  . Acid reflux   . Headache     Surgeries: Procedure(s): LUMBAR 4-5 ANTERIOR LUMBAR FUSION  LUMBAR 4-5 POSTERIOR LUMBAR FUSION WITH INSTRUMENTATION AND ALLOGRAFT ABDOMINAL EXPOSURE on 11/18/2015   Consultants: Vascular, Dr Early for exposure  Discharged Condition: Improved  Hospital Course: Elizabeth Phenixdriana Winger is an 30 y.o. female who was admitted 11/18/2015 for operative treatment of DDD and radiculopathy. Patient has severe unremitting pain that affects sleep, daily activities, and work/hobbies. After pre-op clearance the patient was taken to the operating room on 11/18/2015 and underwent  Procedure(s): LUMBAR 4-5 ANTERIOR LUMBAR FUSION  LUMBAR 4-5 POSTERIOR LUMBAR FUSION WITH INSTRUMENTATION AND ALLOGRAFT ABDOMINAL EXPOSURE.    Patient was given perioperative antibiotics:  Anti-infectives    Start     Dose/Rate Route Frequency Ordered Stop   11/18/15 1845  ceFAZolin (ANCEF) IVPB 1 g/50 mL premix     1 g 100 mL/hr over 30 Minutes Intravenous Every 8 hours 11/18/15 1744 11/19/15 0402   11/18/15 1136  ceFAZolin (ANCEF) 2-4 GM/100ML-% IVPB    Comments:  Donnal MoatPulliam, Nicholas   : cabinet override      11/18/15 1136 11/18/15 1246   11/18/15 0800  ceFAZolin (ANCEF) IVPB 2g/100 mL premix     2 g 200 mL/hr over 30 Minutes Intravenous To ShortStay Surgical 11/17/15 1115 11/18/15 0854       Patient was given sequential compression devices, early ambulation to prevent DVT.  Patient benefited maximally from hospital stay and there were no complications.    Recent vital signs: BP 127/85 mmHg  Pulse 77  Temp(Src) 98.4 F (36.9 C) (Oral)  Resp 16  Ht 5\' 4"  (1.626 m)  Wt 83.507 kg (184 lb  1.6 oz)  BMI 31.59 kg/m2  SpO2 95%  LMP 11/17/2015 (Approximate)   Discharge Medications:     Medication List    STOP taking these medications        acetaminophen 325 MG tablet  Commonly known as:  TYLENOL      TAKE these medications        albuterol 108 (90 Base) MCG/ACT inhaler  Commonly known as:  PROVENTIL HFA;VENTOLIN HFA  Inhale into the lungs every 6 (six) hours as needed for wheezing or shortness of breath. Reported on 11/17/2015        Diagnostic Studies: Dg Chest 2 View  11/12/2015  CLINICAL DATA:  Preoperative study prior to lumbar spine surgery; history of asthma, nonsmoker, no cardiac issues. EXAM: CHEST  2 VIEW COMPARISON:  None in PACs FINDINGS: The lungs are well-expanded. The interstitial markings are coarse. There is no alveolar infiltrate or pleural effusion. The heart and pulmonary vascularity are normal. The trachea is midline. The bony thorax exhibits no acute abnormality. IMPRESSION: Mild interstitial prominence compatible with the history of reactive airway disease. There is no acute cardiopulmonary abnormality. Electronically Signed   By: David  [place]azil[place] M.D.   On: 11/12/2015 15:47   Dg Lumbar Spine 2-3 Views  11/18/2015  CLINICAL DATA:  Lumbar spine surgery. EXAM: DG C-ARM 61-120 MIN; LUMBAR SPINE - 2-3 VIEW COMPARISON:  08/26/2015. FINDINGS: L4-L5 posterior interbody fusion. Anterior screw L5. Hardware intact. Good anatomic alignment. Surgical sponge noted over the soft tissues  of the posterior back. IMPRESSION: Postsurgical changes lumbar spine. Electronically Signed   By: Maisie Fushomas  Register   On: 11/18/2015 14:07   Dg C-arm 61-120 Min  11/18/2015  CLINICAL DATA:  Lumbar spine surgery. EXAM: DG C-ARM 61-120 MIN; LUMBAR SPINE - 2-3 VIEW COMPARISON:  08/26/2015. FINDINGS: L4-L5 posterior interbody fusion. Anterior screw L5. Hardware intact. Good anatomic alignment. Surgical sponge noted over the soft tissues of the posterior back. IMPRESSION: Postsurgical changes  lumbar spine. Electronically Signed   By: Maisie Fushomas  Register   On: 11/18/2015 14:07   Dg Or Local Abdomen  11/18/2015  CLINICAL DATA:  Instrument count. EXAM: OR LOCAL ABDOMEN COMPARISON:  None. FINDINGS: Postsurgical changes are seen in the lower lumbar spine. No other radiopaque foreign body is seen in visualized portion of abdomen. No abnormal bowel gas pattern is noted. IMPRESSION: Postsurgical changes seen in lower lumbar spine. No other radiopaque foreign body seen in visualized portion of abdomen. These results were called by telephone at the time of interpretation on 11/18/2015 at 11:51 am to [facility] Holley, who verbally acknowledged these results. Electronically Signed   By: Lupita RaiderJames  Green Jr, M.D.   On: 11/18/2015 11:52    Disposition: 01-Home or Self Care   POD #2 s/p A/P lumbar fusion, doing well  - up with PT/OT, encourage ambulation -D/C after PT - Percocet for pain, Valium for muscle spasms - likely d/c home today after PT and eating - Up and ambulate in hallways  Signed: Georga BoraMCKENZIE, Baudelia Schroepfer J 11/26/2015, 11:13 AM

## 2016-05-10 ENCOUNTER — Other Ambulatory Visit: Payer: Self-pay | Admitting: Orthopedic Surgery

## 2016-05-10 DIAGNOSIS — M533 Sacrococcygeal disorders, not elsewhere classified: Secondary | ICD-10-CM

## 2016-06-07 ENCOUNTER — Ambulatory Visit
Admission: RE | Admit: 2016-06-07 | Discharge: 2016-06-07 | Disposition: A | Discharge status: Home / Self Care | Payer: Worker's Compensation | Source: Ambulatory Visit | Attending: Orthopedic Surgery | Admitting: Orthopedic Surgery

## 2016-06-07 DIAGNOSIS — M533 Sacrococcygeal disorders, not elsewhere classified: Secondary | ICD-10-CM

## 2016-06-17 NOTE — Op Note (Signed)
Elizabeth Davies:  Davies, Elizabeth Davies            ACCOUNT NO.:  [id number]  MEDICAL RECORD NO.:  [id number]  LOCATION:  [id number]                        FACILITY:  [facility]  PHYSICIAN:  Estill BambergMark Alexa Blish, MD      DATE OF BIRTH:  09/26/1985  DATE OF PROCEDURE:  11/18/2015                              OPERATIVE REPORT   PREOPERATIVE DIAGNOSES: 1. L4-5 degenerative disk disease. 2. Discogenic low back pain associated with L4-5 degenerative disk     disease. 3. Positive diskogram notable for concordant pain associated with the     L4-5 level.  POSTOPERATIVE DIAGNOSES: 1. L4-5 degenerative disk disease. 2. Discogenic low back pain associated with L4-5 degenerative disk     disease. 3. Positive diskogram notable for concordant pain associated with the     L4-5 level.  PROCEDURE: 1. Anterior lumbar interbody fusion, L4-5. 2. Insertion of interbody device x1 (small, 14 mm, 10 degree, Cougar     intervertebral spacer). 3. Placement of anterior instrumentation, L4-5. 4. Use of morselized allograft-DBS mix. 5. Intraoperative use of fluoroscopy. 6. Posterior spinal fusion, L4-5. 7. Placement of posterior instrumentation L4, L5. 8. First assistant for anterior retroperitoneal exposure by Dr. Tawanna Coolerodd     Early as well as procedure.  SURGEON:  Estill BambergMark Mykale Gandolfo, MD  ASSISTANT:  Jason CoopKayla McKenzie, PA-C  ANESTHESIA:  General endotracheal anesthesia.  COMPLICATIONS:  None.  DISPOSITION:  Stable.  ESTIMATED BLOOD LOSS:  Minimal.  INDICATIONS FOR SURGERY:  Briefly, [name] is a pleasant 31 year old female, who did present to me on July 15, 2015, with ongoing and debilitating pain in her low back, status post a work injury that did occur on June 25, 2014.  The patient's pain was consistent with discogenic low back pain.  An MRI did reveal degenerative disk disease at L4-5.  Given the patient's ongoing pain, we did proceed with a discogram.  It was identified during on the diskogram report.  The L4-5 level  was associated with (severe concordant back pain and bilateral leg pain, unquestionable).  Given this, we did discuss proceeding with the procedure reflected above.  The patient was fully aware of the risks and limitations of surgery.  Of particular note, she did understand that surgery was not guarantee of resolution of her low back pain, but did bring with it a 70-80% chance of resolution of her low back pain.  She did elect to proceed.  OPERATIVE DETAILS:  On November 18, 2015, the patient was brought to surgery and general endotracheal anesthesia was administered.  The patient was placed supine on the hospital bed.  All bony prominences were padded. The abdomen was prepped and draped in the usual sterile fashion and an anterior retroperitoneal exposure was then performed by Dr. Gretta Beganodd Early. I did function as his first assistant for the exposure.  Once the L4-5 intervertebral space was identified, I did perform a thorough and complete L4-5 intervertebral diskectomy.  I then placed a series of trials and I did select a most appropriate intervertebral spacer, which was packed with allograft and tamped into position in the usual fashion. I did use intraoperative fluoroscopy in order to optimize appropriate placement of the implant.  I then proceeded with anterior instrumentation.  I did place an anterior screw with buttress fixation over the anterior intervertebral space at the L4-5 level, to help additionally provide stability and to eliminate any chance of anterior migration of the intervertebral implant.    Of note, this instrumentation was not associated with the intervertebral biomechanical device, and was separate, not a part of the previously placed spacer.    The wound was then copiously irrigated.  The fascia was then closed using #1 PDS.  The wound was then closed with 0 Vicryl, followed by 2-0 Vicryl, followed by 4-0 Monocryl. Benzoin and Steri-Strips were applied.  The patient  was then rolled prone onto a Jackson spinal bed.  Paramedian incisions were then made lateral to the pedicles on the left and right sides.  I did expose the posterolateral gutter on the left side and I did proceed with decortication of the posterior elements on the left.  The remainder of the DBS mix was packed into the posterolateral gutter to help aid in the success of the fusion.  I then cannulated the L4 and L5 pedicles using a 5 mm tap.  The tap was then tested using triggered EMG and there was no tap that tested below 12 milliamps.  I then placed 6 x 35 mm screws bilaterally at L4 and at L5.  A 35 mm rod was secured into the tulip heads of the screws.  Caps were placed and a final locking procedure was performed.  I was very pleased with the final AP and lateral fluoroscopic images.  I was very pleased with the fixation.  The wound was then copiously irrigated.  The wound was then closed in layers using #1 Vicryl followed by 2-0 Vicryl, followed by 3-0 Monocryl.  Benzoin and Steri-Strips were then applied followed by sterile dressing.  Of note, I did use neurologic monitoring throughout the surgery and there was no sustained abnormal EMG activity noted.  Of note, Jason CoopKayla McKenzie was my assistant throughout surgery, including in assisting in the anterior and posterior portions of the procedure. She was essential in retraction, suctioning, and closure.     Estill BambergMark Elliet Goodnow, MD     MD/MEDQ  D:  11/18/2015  T:  11/18/2015  Job:  [id number]

## 2018-05-09 IMAGING — CT CT BIOPSY
1 of 3 series · 12 of 32 positions shown, 18 images · non-contrast
Comparison: none

CLINICAL DATA: Right low back, buttock and leg pain.

[Series 3: needle -guided injection · axial · 0.92mm/px · z∈[+683,+755]mm · 12 of 44 slices shown, 18 images]
[im 4/44  soft-tissue]
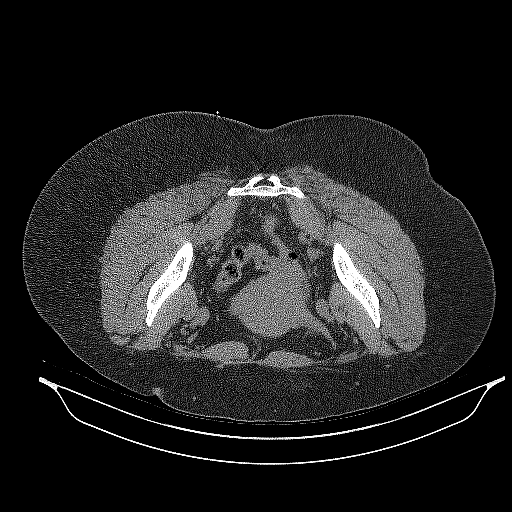
[im 4/44  bone]
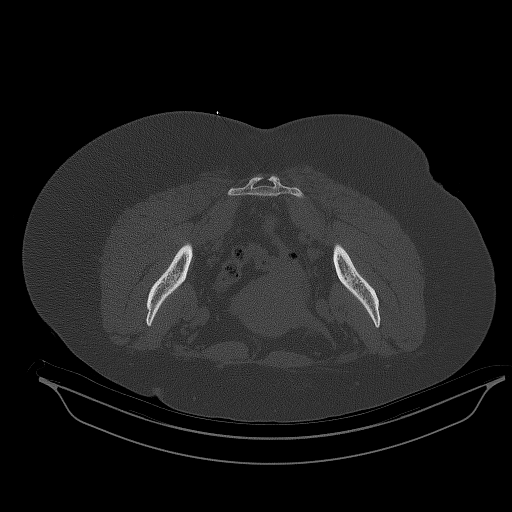
[im 7/44  soft-tissue]
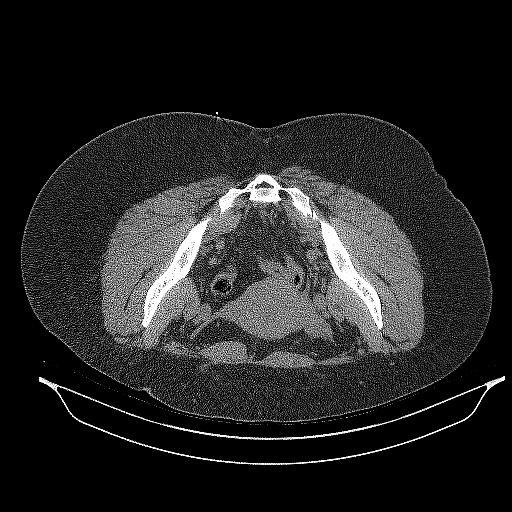
[im 10/44  soft-tissue]
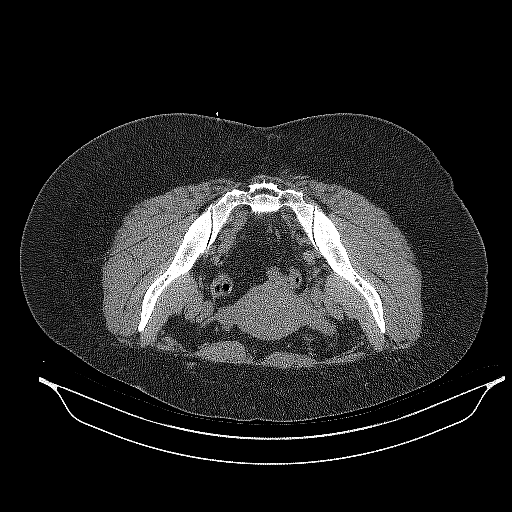
[im 14/44  soft-tissue]
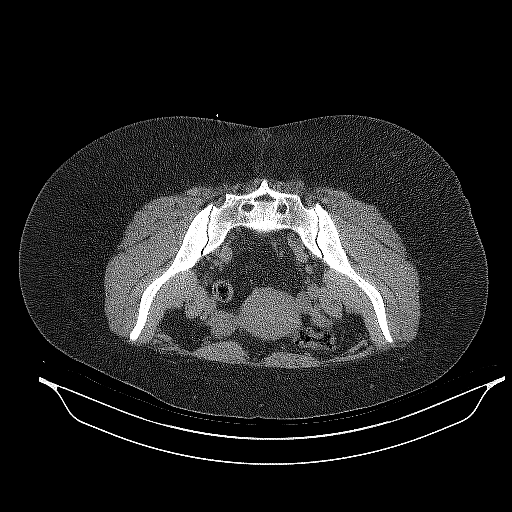
[im 17/44  soft-tissue]
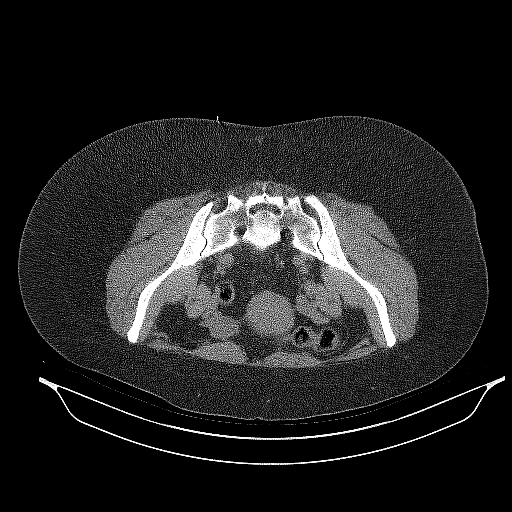
[im 20/44  soft-tissue]
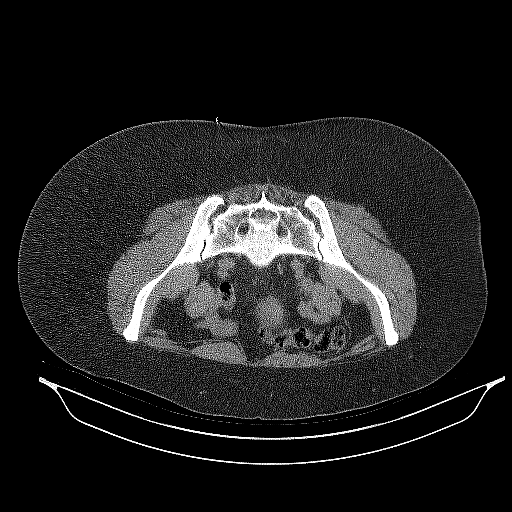
[im 24/44  soft-tissue]
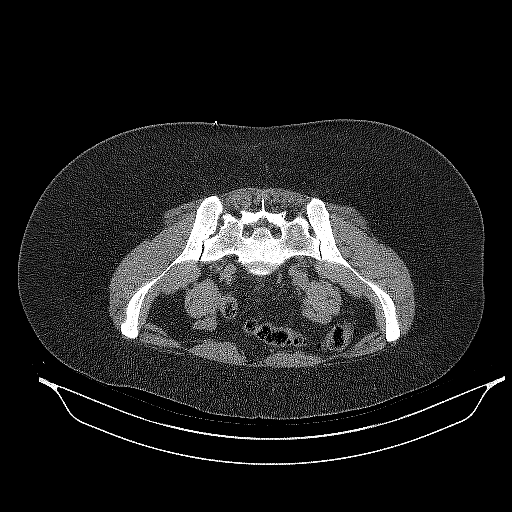
[im 27/44  soft-tissue]
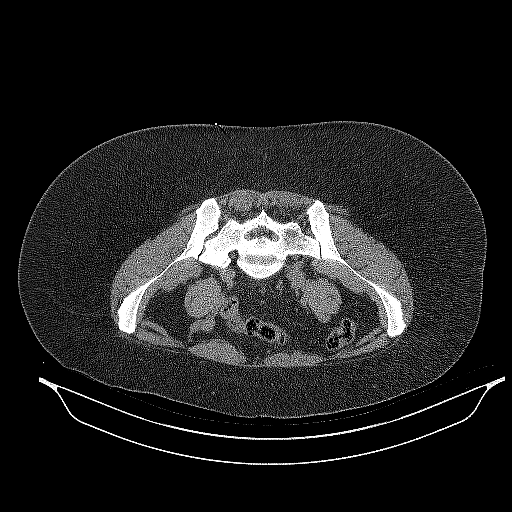
[im 30/44  soft-tissue]
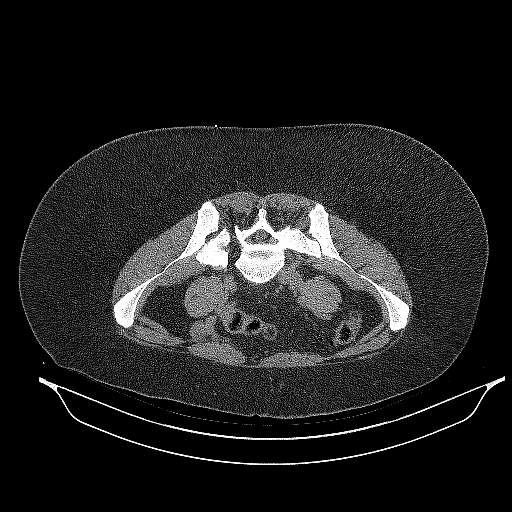
[im 30/44  lung]
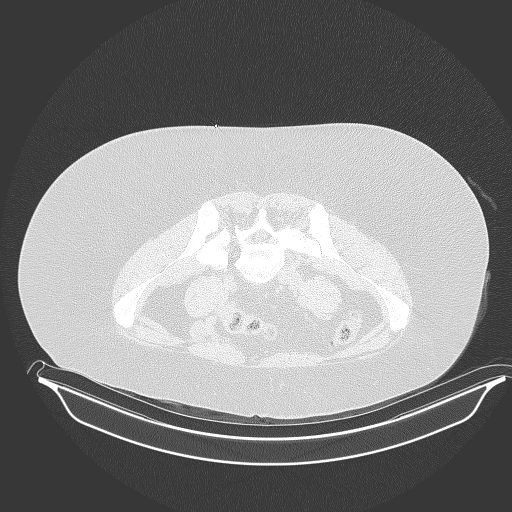
[im 30/44  bone]
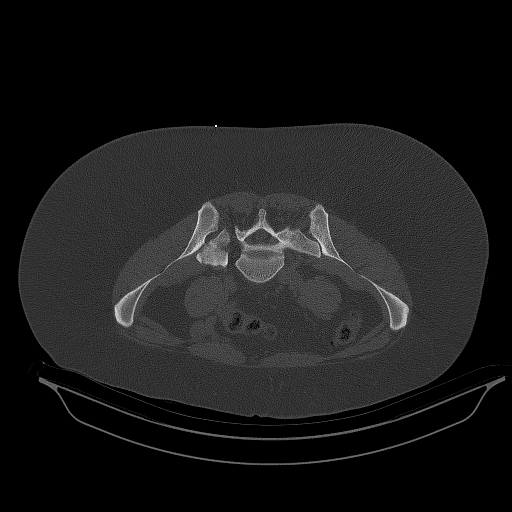
[im 34/44  soft-tissue]
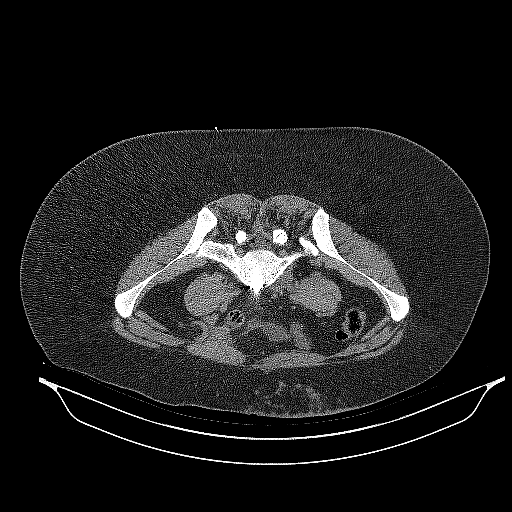
[im 34/44  lung]
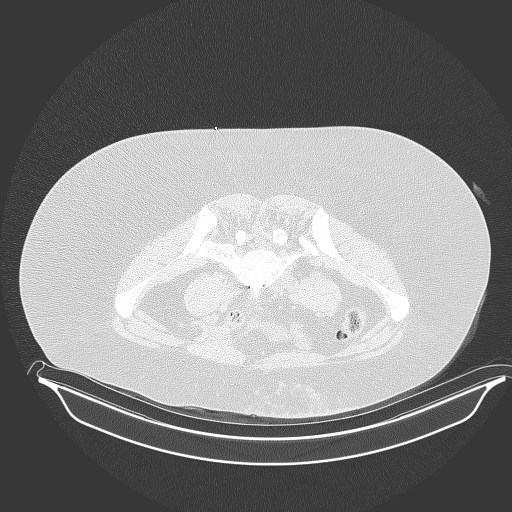
[im 37/44  soft-tissue]
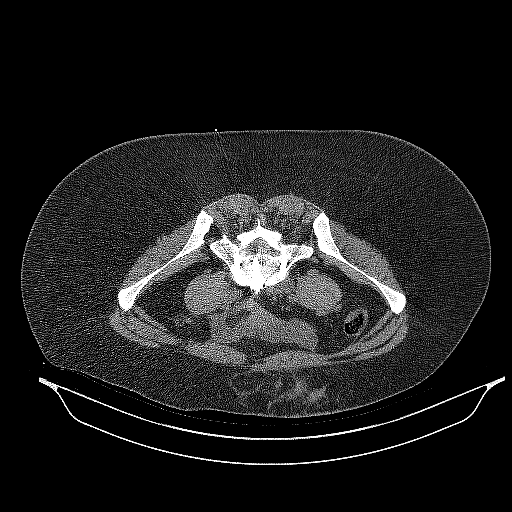
[im 37/44  lung]
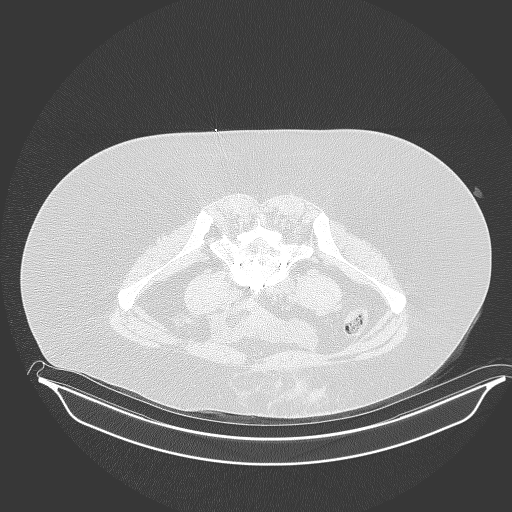
[im 40/44  soft-tissue]
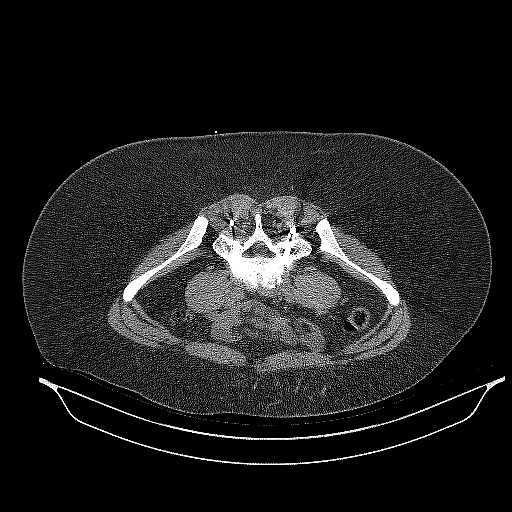
[im 40/44  lung]
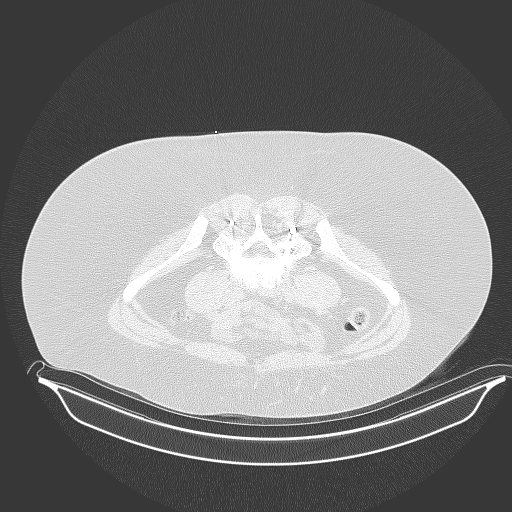

[12 of 32 positions shown; findings below may reference images not displayed]

EXAM:
Right CT GUIDED SI JOINT INJECTION

PROCEDURE:
Spanish language interpreter was present throughout. After a
thorough discussion of risks and benefits of the procedure,
including bleeding, infection, injury to nerves, blood vessels, and
adjacent structures, verbal and written consent was obtained.
Specific risks of the procedure included
nondiagnostic/nontherapeutic injection and non target injection. The
patient was placed prone on the CT table and localization was
performed over the sacrum. Target site marked using CT guidance. The
skin was prepped and draped in the usual sterile fashion using
Betadine soap.

After local anesthesia with 1% lidocaine without epinephrine and
subsequent deep anesthesia, a 22 gauge spinal needle was advanced
into the right SI joint under intermittent CT guidance.

Once the needle was in satisfactory position, representative image
was captured with the needle demonstrated in the sacroiliac joint.
Subsequently, 1.5 cc 0.5% bupivacaine was injected into the right SI
joint. Needles removed and a sterile dressing applied. During the
injection, the patient felt pain that she reported was at least
partially concordant.

No complications were observed.
IMPRESSION: Successful CT-guided right SI joint injection.

The patient was instructed to keep a pain log for discussion with
Dr. Benchimol.
